# Patient Record
Sex: Male | Born: 1960 | Race: White | Hispanic: No | Marital: Married | Smoking: Former smoker
Health system: Southern US, Community
[De-identification: ages and names within clinical notes are randomized; demographics above are authoritative.]

## PROBLEM LIST (undated history)

## (undated) ENCOUNTER — Encounter
Admission: RE | Disposition: A | Discharge status: Home / Self Care | Payer: Self-pay | Source: Ambulatory Visit | Attending: Surgery

## (undated) DIAGNOSIS — R7303 Prediabetes: Secondary | ICD-10-CM

## (undated) DIAGNOSIS — R0600 Dyspnea, unspecified: Secondary | ICD-10-CM

## (undated) DIAGNOSIS — R011 Cardiac murmur, unspecified: Secondary | ICD-10-CM

## (undated) DIAGNOSIS — Z7982 Long term (current) use of aspirin: Secondary | ICD-10-CM

## (undated) DIAGNOSIS — M51369 Other intervertebral disc degeneration, lumbar region without mention of lumbar back pain or lower extremity pain: Secondary | ICD-10-CM

## (undated) DIAGNOSIS — R0789 Other chest pain: Secondary | ICD-10-CM

## (undated) DIAGNOSIS — E785 Hyperlipidemia, unspecified: Secondary | ICD-10-CM

## (undated) DIAGNOSIS — R1031 Right lower quadrant pain: Secondary | ICD-10-CM

## (undated) DIAGNOSIS — G44099 Other trigeminal autonomic cephalgias (TAC), not intractable: Secondary | ICD-10-CM

## (undated) DIAGNOSIS — I10 Essential (primary) hypertension: Secondary | ICD-10-CM

## (undated) DIAGNOSIS — R42 Dizziness and giddiness: Secondary | ICD-10-CM

## (undated) DIAGNOSIS — T753XXA Motion sickness, initial encounter: Secondary | ICD-10-CM

## (undated) DIAGNOSIS — K219 Gastro-esophageal reflux disease without esophagitis: Secondary | ICD-10-CM

## (undated) DIAGNOSIS — R490 Dysphonia: Secondary | ICD-10-CM

## (undated) HISTORY — DX: Gastro-esophageal reflux disease without esophagitis: K21.9

## (undated) HISTORY — DX: Essential (primary) hypertension: I10

## (undated) HISTORY — DX: Hyperlipidemia, unspecified: E78.5

## (undated) HISTORY — PX: APPENDECTOMY: SHX54

## (undated) SURGERY — EXAM UNDER ANESTHESIA
Anesthesia: IV Sedation (MBSC Only)

---

## 1997-11-09 HISTORY — PX: OTHER SURGICAL HISTORY: SHX169

## 2006-11-09 HISTORY — PX: CERVICAL DISCECTOMY: SHX98

## 2007-04-08 ENCOUNTER — Ambulatory Visit (HOSPITAL_COMMUNITY): Admission: RE | Admit: 2007-04-08 | Discharge: 2007-04-08 | Payer: Self-pay | Admitting: Neurological Surgery

## 2007-05-16 ENCOUNTER — Encounter: Admission: RE | Admit: 2007-05-16 | Discharge: 2007-05-16 | Payer: Self-pay | Admitting: Neurological Surgery

## 2011-03-24 NOTE — Op Note (Signed)
NAMEARDEN, TINOCO                 ACCOUNT NO.:  [id number]   MEDICAL RECORD NO.:  [id number]          PATIENT TYPE:  AMB   LOCATION:  SDS                          FACILITY:  [facility]   PHYSICIAN:  Tia Alert, MD     DATE OF BIRTH:  [birth year]   DATE OF PROCEDURE:  04/08/2007  DATE OF DISCHARGE:                               OPERATIVE REPORT   PREOPERATIVE DIAGNOSIS:  Cervical disk herniation, C6-7, with a right C7  radiculopathy.   POSTOPERATIVE DIAGNOSIS:  Cervical disk herniation, C6-7, with a right  C7 radiculopathy.   PROCEDURES PERFORMED:  1. Decompressive anterior cervical diskectomy, C6-7.  2. Anterior cervical arthrodesis, C6-7, utilizing a 7-mm      corticocancellous allograft.  3. Anterior cervical plating, C6-7, utilizing a reflex hybrid plate.   SURGEON:  Tia Alert, MD   ASSISTANT SURGEON:  Donalee Citrin, M.D.   ANESTHESIA:  Endotracheal.   COMPLICATIONS:  None apparent.   INDICATIONS:  The patient is a 50 year old gentleman who is referred  with neck and right arm pain that follows the C7 distribution.  He had  tried medical management for quite some time without significant relief.  His pain was quite severe.  He had an MRI which showed a large herniated  disk at C6-7 to the right with severe compression of the right C7 nerve  root.  I recommended a decompressive anterior cervical diskectomy with  cervical plating at C6-7.  He understood the risks, benefits, expected  outcome and wished to proceed.   DESCRIPTION OF PROCEDURE:  The patient was taken to the operating room.  After the induction of adequate general endotracheal anesthesia, he was  placed in the supine position on the operating room table.  His right  anterior cervical region was prepped with DuraPrep and then draped in  the usual sterile fashion.  Then, 5 mL of local anesthesia was injected.  A transverse incision was made to the right of midline and carried down  to the platysma which was  elevated, opened and undermined with the  Metzenbaum scissors, and then dissected in a plane medial to the  sternocleidomastoid muscle, and then from the carotid artery and lateral  to the trachea and esophagus to expose C6-7 along its column.  The  longus colli muscles were taken-down and a shadow- line retractor was  placed under this.  Once fluoroscopy confirmed the level at C6-7, the  annulus was incised at C6-7.  The initial discectomy was done with  pituitary rongeurs and curved curettes.  I then drilled the endplates to  prepare for arthrodesis and drilled to a height of 7-mm, drilling down  to the level of the posterior longitudinal ligament.  The operating  microscope was brought into the field.  The posterior longitudinal  ligament was opened with a blunt nerve hook and then removed, while  undercutting the bodies of C6 and C7.  Generous foraminotomies were  performed, but especially on the right side.  We identified the right C7  nerve root and followed it out past the pedicle level.  The nerve  root  had a high takeoff.  We pulled several large, free fragments from the  interspace, both from the axilla of the nerve root and from the shoulder  of the nerve root and lateral to the nerve root until the nerve root was  free and pulsatile.  We could see the spinal cord pulsatile through the  dura.  We then used the nerve hook to palpate into the foramina and into  the midline to assure adequate decompression.  The dura was capacious  and full all away across.  I then irrigated it with saline solution  containing bacitracin.  I measured the interspace to be 7-mm.  I used a  7-mm corticocancellous Allograft and tapped this into position at C6-7.  I then used a reflex hyperplate and placed two, 14-mm variable angle  screws into the bodies of C6 and C7, and these locked into the plate by  the locking mechanism within the plate.  I then irrigated with saline  solution containing  bacitracin to all bleeding points.  Once meticulous  hemostasis was achieved, I closed the platysma with 3-0 Vicryl.  I  closed the subcuticular tissue with 3-0 Vicryl and closed the skin with  benzoin and Steri-Strips.  The drapes were removed and a sterile  dressing was applied.   The patient was awakened from general anesthesia and transported to the  recovery room in stable condition.   At the end of the procedure, all sponge, needle and instrument counts  were correct.      Tia Alert, MD  Electronically Signed     DSJ/MEDQ  D:  04/08/2007  T:  04/08/2007  Job:  [id number]

## 2011-10-26 ENCOUNTER — Ambulatory Visit: Payer: Self-pay | Admitting: Urology

## 2012-07-04 ENCOUNTER — Ambulatory Visit: Payer: Self-pay | Admitting: Neurology

## 2012-07-14 ENCOUNTER — Ambulatory Visit: Payer: Self-pay | Admitting: Neurology

## 2012-11-09 HISTORY — PX: COLONOSCOPY: SHX174

## 2013-10-20 ENCOUNTER — Ambulatory Visit: Payer: Self-pay | Admitting: Gastroenterology

## 2014-08-31 ENCOUNTER — Encounter: Payer: Self-pay | Admitting: Family Medicine

## 2014-08-31 LAB — LIPID PANEL
Cholesterol, Total: 270 — AB (ref 100–199)
HDL: 30 — AB (ref 39–?)
LDL Cholesterol (Calc): 162 — AB (ref 0–99)
Triglycerides: 391 — AB (ref 0–149)
VLDL: 78 mg/dL

## 2014-08-31 LAB — TSH: TSH: 0.612 (ref 0.45–4.5)

## 2015-01-30 ENCOUNTER — Ambulatory Visit: Payer: Self-pay

## 2015-03-23 DIAGNOSIS — B019 Varicella without complication: Secondary | ICD-10-CM | POA: Insufficient documentation

## 2015-03-23 DIAGNOSIS — I10 Essential (primary) hypertension: Secondary | ICD-10-CM | POA: Insufficient documentation

## 2015-03-23 DIAGNOSIS — R519 Headache, unspecified: Secondary | ICD-10-CM | POA: Insufficient documentation

## 2015-03-23 DIAGNOSIS — K219 Gastro-esophageal reflux disease without esophagitis: Secondary | ICD-10-CM | POA: Insufficient documentation

## 2015-03-23 DIAGNOSIS — R51 Headache: Secondary | ICD-10-CM

## 2015-03-23 DIAGNOSIS — R1013 Epigastric pain: Secondary | ICD-10-CM | POA: Insufficient documentation

## 2015-03-23 DIAGNOSIS — B059 Measles without complication: Secondary | ICD-10-CM | POA: Insufficient documentation

## 2015-03-23 DIAGNOSIS — E785 Hyperlipidemia, unspecified: Secondary | ICD-10-CM | POA: Insufficient documentation

## 2015-03-23 DIAGNOSIS — R5383 Other fatigue: Secondary | ICD-10-CM | POA: Insufficient documentation

## 2015-03-23 DIAGNOSIS — K922 Gastrointestinal hemorrhage, unspecified: Secondary | ICD-10-CM | POA: Insufficient documentation

## 2015-03-23 DIAGNOSIS — K449 Diaphragmatic hernia without obstruction or gangrene: Secondary | ICD-10-CM

## 2015-03-25 ENCOUNTER — Ambulatory Visit
Admission: RE | Admit: 2015-03-25 | Discharge: 2015-03-25 | Disposition: A | Discharge status: Home / Self Care | Payer: BLUE CROSS/BLUE SHIELD | Source: Ambulatory Visit | Attending: Family Medicine | Admitting: Family Medicine

## 2015-03-25 ENCOUNTER — Other Ambulatory Visit: Payer: Self-pay | Admitting: Family Medicine

## 2015-03-25 ENCOUNTER — Encounter: Payer: Self-pay | Admitting: Family Medicine

## 2015-03-25 DIAGNOSIS — R1032 Left lower quadrant pain: Secondary | ICD-10-CM | POA: Insufficient documentation

## 2015-03-25 DIAGNOSIS — R0781 Pleurodynia: Secondary | ICD-10-CM | POA: Diagnosis present

## 2015-03-25 LAB — COMPREHENSIVE METABOLIC PANEL
ALT: 50 — AB (ref 0–44)
AST: 24 U/L (ref 0–40)
Albumin/Globulin Ratio: 1.8
Albumin: 4.3
Alkaline Phosphatase: 82 U/L (ref 39–117)
BILIRUBIN TOTAL: 0.6 mg/dL
BUN / CREAT RATIO: 16
BUN: 13
CO2: 24
CREATININE: 0.83
Calcium: 9.1 mg/dL
Chloride: 104 mmol/L
GFR CALC NON AF AMER: 101
GLOBULIN, TOTAL: 2.4
Glucose: 96
PROTEIN: 6.7
Potassium: 4.1 mmol/L
Sodium: 144

## 2015-04-16 ENCOUNTER — Other Ambulatory Visit: Payer: Self-pay

## 2015-04-16 DIAGNOSIS — R310 Gross hematuria: Secondary | ICD-10-CM

## 2015-04-24 ENCOUNTER — Other Ambulatory Visit: Payer: Self-pay | Admitting: Family Medicine

## 2015-04-24 MED ORDER — SIMVASTATIN 20 MG PO TABS: 20.0000 mg | ORAL_TABLET | Freq: Every day | ORAL | Status: DC

## 2015-04-30 ENCOUNTER — Ambulatory Visit
Admission: RE | Admit: 2015-04-30 | Discharge: 2015-04-30 | Disposition: A | Discharge status: Home / Self Care | Payer: BLUE CROSS/BLUE SHIELD | Source: Ambulatory Visit | Attending: Urology | Admitting: Urology

## 2015-04-30 DIAGNOSIS — M549 Dorsalgia, unspecified: Secondary | ICD-10-CM | POA: Insufficient documentation

## 2015-04-30 DIAGNOSIS — R1032 Left lower quadrant pain: Secondary | ICD-10-CM | POA: Insufficient documentation

## 2015-04-30 DIAGNOSIS — R310 Gross hematuria: Secondary | ICD-10-CM

## 2015-04-30 DIAGNOSIS — G8929 Other chronic pain: Secondary | ICD-10-CM | POA: Diagnosis not present

## 2015-04-30 MED ORDER — IOHEXOL 300 MG/ML  SOLN
125.0000 mL | Freq: Once | INTRAMUSCULAR | Status: AC | PRN
  Administered 2015-04-30: 125 mL via INTRAVENOUS

## 2015-05-01 ENCOUNTER — Telehealth: Payer: Self-pay | Admitting: Family Medicine

## 2015-05-01 NOTE — Telephone Encounter (Signed)
Pt needs CT test result. It's on your desk.

## 2015-05-01 NOTE — Telephone Encounter (Signed)
I would be glad to refer him to Dr. Evette Cristal or Rosezetta Schlatter if he doesn't want to go to [facility] surgical, or to [facility] general surgery.  It is not that I want him to be in pain, I just don't know what is causing the pain.

## 2015-05-02 NOTE — Telephone Encounter (Signed)
Pt advised. He will call if he decide to see any general surgeon and will be happy to make that referral.

## 2015-05-06 ENCOUNTER — Telehealth: Payer: Self-pay | Admitting: Family Medicine

## 2015-05-06 NOTE — Telephone Encounter (Signed)
Pt states he received his test results last week but still had a few questions.

## 2015-05-06 NOTE — Telephone Encounter (Signed)
Called pt and left message to call us back

## 2015-05-08 NOTE — Telephone Encounter (Signed)
No answer twice

## 2015-05-09 NOTE — Telephone Encounter (Signed)
Called and someone sat phone down then it hung up a few minutes later. Sounded like they were in a car. I have attempted to reach this person several times. I am glad to answer his questions but will wait for him to contact us again. I am closing this note. [facility]

## 2015-05-10 ENCOUNTER — Telehealth: Payer: Self-pay | Admitting: Family Medicine

## 2015-05-10 NOTE — Telephone Encounter (Signed)
Pt. Return your call  [phone]. Thanks

## 2015-05-10 NOTE — Telephone Encounter (Signed)
Spoke to pt wants to get reason for groin pain and as per Amy xray shows constipation and dulcolax can help pt is already doing that and next we can refer him to Dr. Evette CristalSankar but pt will call us if he decided to schedule. Nisha

## 2015-05-10 NOTE — Telephone Encounter (Signed)
Ok-jh 

## 2015-05-15 ENCOUNTER — Ambulatory Visit: Payer: Self-pay | Admitting: Urology

## 2015-05-31 ENCOUNTER — Ambulatory Visit: Payer: Self-pay | Admitting: Family Medicine

## 2015-06-21 ENCOUNTER — Telehealth: Payer: Self-pay | Admitting: Family Medicine

## 2015-06-21 ENCOUNTER — Other Ambulatory Visit: Payer: Self-pay | Admitting: Family Medicine

## 2015-06-21 MED ORDER — SIMVASTATIN 20 MG PO TABS: 20.0000 mg | ORAL_TABLET | Freq: Every day | ORAL | Status: DC

## 2015-06-21 NOTE — Telephone Encounter (Signed)
Pt tried to refill his simvastatin but the pharmacy said there wasn't any refills left.  He is leaving to go out of the country Monday morning and will be out.  He will return on Sept 20th.  Please call [phone]

## 2015-06-21 NOTE — Telephone Encounter (Signed)
Please review

## 2015-06-24 ENCOUNTER — Other Ambulatory Visit: Payer: Self-pay | Admitting: Family Medicine

## 2015-06-24 MED ORDER — SIMVASTATIN 20 MG PO TABS: 20.0000 mg | ORAL_TABLET | Freq: Every day | ORAL | Status: DC

## 2015-12-30 DIAGNOSIS — M7040 Prepatellar bursitis, unspecified knee: Secondary | ICD-10-CM | POA: Insufficient documentation

## 2016-02-21 ENCOUNTER — Other Ambulatory Visit: Payer: Self-pay | Admitting: Family Medicine

## 2016-03-13 ENCOUNTER — Encounter: Payer: Self-pay | Admitting: Family Medicine

## 2016-03-13 ENCOUNTER — Ambulatory Visit (INDEPENDENT_AMBULATORY_CARE_PROVIDER_SITE_OTHER): Payer: Self-pay | Admitting: Family Medicine

## 2016-03-13 VITALS — BP 130/86 | HR 66 | Temp 98.4°F | Resp 16 | Ht 70.0 in | Wt 193.0 lb

## 2016-03-13 DIAGNOSIS — E785 Hyperlipidemia, unspecified: Secondary | ICD-10-CM

## 2016-03-13 DIAGNOSIS — R0789 Other chest pain: Secondary | ICD-10-CM

## 2016-03-13 DIAGNOSIS — M7522 Bicipital tendinitis, left shoulder: Secondary | ICD-10-CM

## 2016-03-13 MED ORDER — NAPROXEN 500 MG PO TABS: 500.0000 mg | ORAL_TABLET | Freq: Two times a day (BID) | ORAL | Status: DC

## 2016-03-13 NOTE — Assessment & Plan Note (Signed)
Check lipids to determine if at goal. Consider change to atorvastatin or crestor.

## 2016-03-13 NOTE — Progress Notes (Signed)
Subjective:    Patient ID: Charles Shepard, male    DOB: 06/29/1961, 55 y.o.   MRN: [id number]  HPI: Charles Shepard is a 55 y.o. male presenting on 03/13/2016 for Arm Pain   HPI  Presents for L arm pain x a few month. Pain is located in the elbow.  When patient sleeps arm goes numb. Happens every night when he sleeps. Pain occurs during day time. Sometimes worse than others. Hurts and AC fossa. Noticed the L arm is weaker. Job requires lots of lifting. No numbness during the day. Home treatment: heat.  Pt did endorse to nurse some chest pains. Noticed squeezing in the chest a few times in the past month. Sensation lasted about 30 seconds and chest felt heavy for a few minutes. No SOB. No diaphoresis. No radiation to arm and jaw.  Occurred a night in bed. Once in the AM- getting ready for work. Travels a lot for work. Brother had heart attack age 54. Non-smoker.  Smokes pot.   Past Medical History  Diagnosis Date  . GERD (gastroesophageal reflux disease)   . Dyslipidemia     Current Outpatient Prescriptions on File Prior to Visit  Medication Sig  . DEXILANT 60 MG capsule TAKE ONE CAPSULE BY MOUTH DAILY  . ranitidine (ZANTAC) 150 MG tablet Take by mouth.  . simvastatin (ZOCOR) 20 MG tablet Take 1 tablet (20 mg total) by mouth at bedtime.   No current facility-administered medications on file prior to visit.    Review of Systems  Constitutional: Negative for fever and chills.  HENT: Negative.   Respiratory: Negative for chest tightness, shortness of breath and wheezing.   Cardiovascular: Positive for chest pain. Negative for palpitations and leg swelling.  Gastrointestinal: Negative for nausea, vomiting and abdominal pain.  Endocrine: Negative.   Genitourinary: Negative for dysuria, urgency, discharge, penile pain and testicular pain.  Musculoskeletal: Positive for myalgias and arthralgias. Negative for back pain and joint swelling.  Skin: Negative.   Neurological: Positive for  weakness (Feels L arm is weak. ). Negative for dizziness, syncope, numbness and headaches.  Psychiatric/Behavioral: Negative for sleep disturbance and dysphoric mood.   Per HPI unless specifically indicated above     Objective:    BP 130/86 mmHg  Pulse 66  Temp(Src) 98.4 F (36.9 C) (Oral)  Resp 16  Ht 5\' 10"  (1.778 m)  Wt 193 lb (87.544 kg)  BMI 27.69 kg/m2  Wt Readings from Last 3 Encounters:  03/13/16 193 lb (87.544 kg)  03/23/15 191 lb (86.637 kg)    Physical Exam  Constitutional: He is oriented to person, place, and time. He appears well-developed and well-nourished. No distress.  HENT:  Head: Normocephalic and atraumatic.  Neck: Neck supple. No thyromegaly present.  Cardiovascular: Normal rate, regular rhythm, normal heart sounds and normal pulses.  Exam reveals no gallop and no friction rub.   No murmur heard. Pulmonary/Chest: Effort normal and breath sounds normal. He has no wheezes. He has no rhonchi. He has no rales.  Abdominal: Soft. Bowel sounds are normal. He exhibits no distension. There is no tenderness. There is no rebound.  Musculoskeletal: Normal range of motion. He exhibits no edema.       Left shoulder: Normal.       Right elbow: Normal.      Left elbow: He exhibits normal range of motion and no swelling. Tenderness (at the biceps tendon in the [facility]a) found.       Right upper arm:  He exhibits tenderness (along biceps tendon).  Neurological: He is alert and oriented to person, place, and time. He has normal reflexes.  Skin: Skin is warm and dry. No rash noted. No erythema.  Psychiatric: He has a normal mood and affect. His behavior is normal. Thought content normal.   No results found for this or any previous visit.    Assessment & Plan:   Problem List Items Addressed This Visit      Other   Dyslipidemia    Check lipids to determine if at goal. Consider change to atorvastatin or crestor.        Other Visit Diagnoses    Chest pressure    -   Primary    ECG WNL. However given risk factors and family history- pt will benefit from cardiology consult. Pt to ER for chest pain occurs again. Check CMET and lipids.    Relevant Orders    EKG 12-Lead    Comprehensive metabolic panel    Lipid Profile    Ambulatory referral to Cardiology    Biceps tendonitis on left        BID naproxen for pain and swelling. Heat or ice for comfort. No heavy lifting. Recheck 2-4 weeks if needed.     Relevant Medications    naproxen (NAPROSYN) 500 MG tablet       Meds ordered this encounter  Medications  . naproxen (NAPROSYN) 500 MG tablet    Sig: Take 1 tablet (500 mg total) by mouth 2 (two) times daily with a meal.    Dispense:  30 tablet    Refill:  1    Order Specific Question:  Supervising Provider    Answer:  Janeann Forehand [[id number]]      Follow up plan: Return in about 4 weeks (around 04/10/2016) for elbow pain.

## 2016-03-13 NOTE — Patient Instructions (Signed)
Tendonitis elbow: Take naproxen 500mg  twice daily to 1-2 weeks. No heavy lifting. Rest arm as much as you can. Use heat and ice as needed for pain.   Chest pains: Go to the ER if they occur again. I think it would be a good idea to see cardiology given your brother's history.  Please seek immediate medical attention at ER or Urgent Care if you develop: Chest pain, pressure or tightness. Shortness of breath accompanied by nausea or diaphoresis Visual changes Numbness or tingling on one side of the body Facial droop Altered mental status Or any concerning symptoms.

## 2016-03-24 ENCOUNTER — Encounter: Payer: Self-pay | Admitting: *Deleted

## 2016-03-24 ENCOUNTER — Ambulatory Visit: Payer: BLUE CROSS/BLUE SHIELD | Admitting: Cardiovascular Disease

## 2016-04-02 NOTE — Telephone Encounter (Signed)
This encounter was created in error - please disregard.

## 2016-04-03 ENCOUNTER — Emergency Department
Admission: EM | Admit: 2016-04-03 | Discharge: 2016-04-04 | Disposition: A | Discharge status: Home / Self Care | Payer: No Typology Code available for payment source | Attending: Emergency Medicine | Admitting: Emergency Medicine

## 2016-04-03 ENCOUNTER — Emergency Department: Payer: No Typology Code available for payment source

## 2016-04-03 DIAGNOSIS — Z79899 Other long term (current) drug therapy: Secondary | ICD-10-CM | POA: Diagnosis not present

## 2016-04-03 DIAGNOSIS — Z87891 Personal history of nicotine dependence: Secondary | ICD-10-CM | POA: Diagnosis not present

## 2016-04-03 DIAGNOSIS — Y92410 Unspecified street and highway as the place of occurrence of the external cause: Secondary | ICD-10-CM | POA: Insufficient documentation

## 2016-04-03 DIAGNOSIS — Y999 Unspecified external cause status: Secondary | ICD-10-CM | POA: Insufficient documentation

## 2016-04-03 DIAGNOSIS — S92351A Displaced fracture of fifth metatarsal bone, right foot, initial encounter for closed fracture: Secondary | ICD-10-CM | POA: Insufficient documentation

## 2016-04-03 DIAGNOSIS — S99921A Unspecified injury of right foot, initial encounter: Secondary | ICD-10-CM | POA: Diagnosis present

## 2016-04-03 DIAGNOSIS — Y9389 Activity, other specified: Secondary | ICD-10-CM | POA: Insufficient documentation

## 2016-04-03 DIAGNOSIS — S92301A Fracture of unspecified metatarsal bone(s), right foot, initial encounter for closed fracture: Secondary | ICD-10-CM

## 2016-04-03 MED ORDER — FENTANYL CITRATE (PF) 100 MCG/2ML IJ SOLN
50.0000 ug | Freq: Once | INTRAMUSCULAR | Status: AC
  Administered 2016-04-03: 50 ug via INTRAVENOUS
  Filled 2016-04-03: qty 2

## 2016-04-03 MED ORDER — ONDANSETRON HCL 4 MG/2ML IJ SOLN
4.0000 mg | Freq: Once | INTRAMUSCULAR | Status: AC
  Administered 2016-04-03: 4 mg via INTRAVENOUS
  Filled 2016-04-03: qty 2

## 2016-04-03 NOTE — ED Notes (Signed)
MD at bedside. 
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
## 2016-04-04 ENCOUNTER — Emergency Department: Payer: No Typology Code available for payment source

## 2016-04-04 LAB — BASIC METABOLIC PANEL
ANION GAP: 6 (ref 5–15)
BUN: 17 mg/dL (ref 6–20)
CHLORIDE: 109 mmol/L (ref 101–111)
CO2: 25 mmol/L (ref 22–32)
Calcium: 9 mg/dL (ref 8.9–10.3)
Creatinine, Ser: 1.17 mg/dL (ref 0.61–1.24)
GFR calc Af Amer: >60 mL/min (ref >60)
Glucose, Bld: 92 mg/dL (ref 65–99)
POTASSIUM: 3.9 mmol/L (ref 3.5–5.1)
SODIUM: 140 mmol/L (ref 135–145)

## 2016-04-04 LAB — CBC WITH DIFFERENTIAL/PLATELET
Basophils Absolute: 0 10*3/uL (ref 0–0.1)
Basophils Relative: 1 %
EOS ABS: 0.2 10*3/uL (ref 0–0.7)
EOS PCT: 3 %
HCT: 45.3 % (ref 40.0–52.0)
Hemoglobin: 15.6 g/dL (ref 13.0–18.0)
LYMPHS ABS: 2.1 10*3/uL (ref 1.0–3.6)
LYMPHS PCT: 32 %
MCH: 32.4 pg (ref 26.0–34.0)
MCHC: 34.5 g/dL (ref 32.0–36.0)
MCV: 93.9 fL (ref 80.0–100.0)
MONO ABS: 0.7 10*3/uL (ref 0.2–1.0)
Monocytes Relative: 11 %
Neutro Abs: 3.5 10*3/uL (ref 1.4–6.5)
Neutrophils Relative %: 53 %
PLATELETS: 217 10*3/uL (ref 150–440)
RBC: 4.82 MIL/uL (ref 4.40–5.90)
RDW: 13.6 % (ref 11.5–14.5)
WBC: 6.5 10*3/uL (ref 3.8–10.6)

## 2016-04-04 MED ORDER — OXYCODONE-ACETAMINOPHEN 5-325 MG PO TABS
ORAL_TABLET | ORAL | Status: AC
  Filled 2016-04-04: qty 1

## 2016-04-04 MED ORDER — IOPAMIDOL (ISOVUE-300) INJECTION 61%
100.0000 mL | Freq: Once | INTRAVENOUS | Status: AC | PRN
Start: 2016-04-04 — End: 2016-04-04
  Administered 2016-04-04: 100 mL via INTRAVENOUS

## 2016-04-04 MED ORDER — OXYCODONE-ACETAMINOPHEN 5-325 MG PO TABS
2.0000 | ORAL_TABLET | Freq: Once | ORAL | Status: AC
  Administered 2016-04-04: 2 via ORAL

## 2016-04-04 MED ORDER — OXYCODONE-ACETAMINOPHEN 5-325 MG PO TABS: 1.0000 | ORAL_TABLET | Freq: Four times a day (QID) | ORAL | Status: DC | PRN

## 2016-04-04 MED ORDER — OXYCODONE-ACETAMINOPHEN 5-325 MG PO TABS
ORAL_TABLET | ORAL | Status: DC
Start: 2016-04-04 — End: 2016-04-04
  Filled 2016-04-04: qty 2

## 2016-04-04 NOTE — ED Notes (Signed)
Reviewed d/c instructions, follow-up care, prescriptions, use of ice/elevation with pt. Pt verbalized understanding 
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
## 2016-04-04 NOTE — ED Notes (Signed)
Paula from lab called back. Reported she found blood tubes, and would begin processing blood immediately

## 2016-04-04 NOTE — ED Notes (Signed)
Called lab to check status of pt's bloodwork

## 2016-04-23 ENCOUNTER — Other Ambulatory Visit: Payer: Self-pay | Admitting: Family Medicine

## 2016-05-11 ENCOUNTER — Ambulatory Visit
Admission: RE | Admit: 2016-05-11 | Discharge: 2016-05-11 | Disposition: A | Discharge status: Home / Self Care | Payer: 59 | Source: Ambulatory Visit | Attending: Family Medicine | Admitting: Family Medicine

## 2016-05-11 ENCOUNTER — Ambulatory Visit (INDEPENDENT_AMBULATORY_CARE_PROVIDER_SITE_OTHER): Payer: 59 | Admitting: Family Medicine

## 2016-05-11 ENCOUNTER — Encounter: Payer: Self-pay | Admitting: Family Medicine

## 2016-05-11 VITALS — BP 125/82 | HR 69 | Temp 97.7°F | Resp 16 | Ht 70.0 in | Wt 195.0 lb

## 2016-05-11 DIAGNOSIS — M7522 Bicipital tendinitis, left shoulder: Secondary | ICD-10-CM | POA: Diagnosis not present

## 2016-05-11 DIAGNOSIS — M25552 Pain in left hip: Secondary | ICD-10-CM | POA: Diagnosis not present

## 2016-05-11 MED ORDER — NAPROXEN 500 MG PO TABS: 500.0000 mg | ORAL_TABLET | Freq: Two times a day (BID) | ORAL | Status: DC
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
## 2016-05-15 ENCOUNTER — Ambulatory Visit: Payer: Self-pay | Admitting: Family Medicine

## 2016-06-17 ENCOUNTER — Other Ambulatory Visit: Payer: Self-pay | Admitting: Family Medicine

## 2016-09-25 ENCOUNTER — Ambulatory Visit (INDEPENDENT_AMBULATORY_CARE_PROVIDER_SITE_OTHER): Payer: 59 | Admitting: Family Medicine

## 2016-09-25 ENCOUNTER — Encounter: Payer: Self-pay | Admitting: Family Medicine

## 2016-09-25 VITALS — BP 127/84 | HR 70 | Temp 98.2°F | Resp 16 | Ht 70.0 in | Wt 195.0 lb

## 2016-09-25 DIAGNOSIS — K601 Chronic anal fissure: Secondary | ICD-10-CM | POA: Diagnosis not present

## 2016-09-25 DIAGNOSIS — L290 Pruritus ani: Secondary | ICD-10-CM | POA: Diagnosis not present

## 2016-09-25 MED ORDER — TRIAMCINOLONE ACETONIDE 0.025 % EX CREA: 1.0000 "application " | TOPICAL_CREAM | Freq: Two times a day (BID) | CUTANEOUS | 1 refills | Status: DC

## 2016-09-25 NOTE — Patient Instructions (Signed)
Thank you for coming in to clinic today.  1. For your rectal itching and bleeding. I do see evidence of irritated inflamed skin, which we can see in a variety of skin conditions, sometimes even psoriasis can only affect this region to cause this problem. It makes the skin thin and easy to have fissures or tears that cause bleeding and worsening itching.  Try topical steroid cream small amount around anus externally only, twice a day for 2 weeks. Try to keep this area dry and clean as you are. - Stop medicine after 2 weeks - If itching is worse you can dry benadryl at night  In future if the fissure is not healing well, we can try other topical rx to help it heal  We may need to get you established with Dermatology if not making progress.  Please request your Colonoscopy and Endoscopy report from Dr Annabell SabalWohl's office.  [facility] [place] Location [address] [place], [place] [address] Phone: [phone]  [facility] [address] [place], [place] [address] Phone: [phone]    Please schedule a follow-up appointment with Dr. Althea CharonKaramalegos in 2 to 4 weeks to follow-up Rectal Bleeding / Itching  If you have any other questions or concerns, please feel free to call the clinic or send a message through MyChart. You may also schedule an earlier appointment if necessary.  Charles PilarAlexander Kino Dunsworth, DO [facility]

## 2016-09-25 NOTE — Progress Notes (Signed)
Subjective:    Patient ID: Charles Shepard, male    DOB: 01/28/1961, 55 y.o.   MRN: [id number]  Charles GladdenDarryl L Shepard is a 55 y.o. male presenting on 09/25/2016 for Rectal Bleeding (tender and bleed a little onset several month)   HPI   RECTAL PAIN / BLEEDING: - Reports he has had rectal itching over past several months, also associated with tenderness. He does admit to seeing some bright red blood small streaks on toilet paper only occasionally about 1-2x a week. Worse itching at night, tender to touch but not at rest or sitting. Also with occasional burning sensation with bowel movements. - Regular bowel movements 3-4 times daily, soft, not dry or hard, no straining, without pain or cramping - No prior diagnosis of hemorrhoids - Previously had colonoscopy 10/2013 and upper endoscopy per GI Dr Charles Shepard, he does not recall the results. Also h/o GERD. Unsure if he has ever had PUD. Unsure if prior hemorrhoids. GERD is controlled mostly on Dexilant 60mg , it does get worse with eating food later in evening but denies constant abdominal pain related to eating. - Takes ibuprofen infrequently maybe 1x monthly now, no regular NSAID use - Denies any recent illness, fevers/chills, dark stools, blood mixed with stool, abdominal distention or pain, constipation, rash  Social History  Substance Use Topics  . Smoking status: Former Games developermoker  . Smokeless tobacco: Not on file  . Alcohol use 3.6 oz/week    6 Cans of beer per week    Review of Systems Per HPI unless specifically indicated above     Objective:    BP 127/84   Pulse 70   Temp 98.2 F (36.8 C) (Oral)   Resp 16   Ht 5\' 10"  (1.778 m)   Wt 195 lb (88.5 kg)   BMI 27.98 kg/m   Wt Readings from Last 3 Encounters:  09/25/16 195 lb (88.5 kg)  05/11/16 195 lb (88.5 kg)  04/03/16 190 lb (86.2 kg)    Physical Exam  Constitutional: He appears well-developed and well-nourished. No distress.  Well-appearing, comfortable, cooperative  Cardiovascular:  Normal rate.   Pulmonary/Chest: Effort normal.  Abdominal: Soft.  Genitourinary:  Genitourinary Comments: Rectal/DRE: External anus with diffuse erythematous appearance, see skin findings. Posteriorly very thin skin with some linear semi healed anal fissures x 2, no active bleeding but potentially could easily bleed. Anteriorly mild flat soft external hemorrhoidal tissue that is not inflamed.  DRE without appreciation of internal hemorrhoids or rectal bleeding.  Neurological: He is alert.  Skin: Skin is warm and dry. Rash (Peri-anal) noted. He is not diaphoretic. There is erythema (Peri-anal skin diffusely erythematous appearing without rash or scaling.).  Nursing note and vitals reviewed.   Results for orders placed or performed during the hospital encounter of 04/03/16  CBC with Differential  Result Value Ref Range   WBC 6.5 3.8 - 10.6 K/uL   RBC 4.82 4.40 - 5.90 MIL/uL   Hemoglobin 15.6 13.0 - 18.0 g/dL   HCT 40.945.3 81.140.0 - 91.452.0 %   MCV 93.9 80.0 - 100.0 fL   MCH 32.4 26.0 - 34.0 pg   MCHC 34.5 32.0 - 36.0 g/dL   RDW 78.213.6 95.611.5 - 21.314.5 %   Platelets 217 150 - 440 K/uL   Neutrophils Relative % 53 %   Neutro Abs 3.5 1.4 - 6.5 K/uL   Lymphocytes Relative 32 %   Lymphs Abs 2.1 1.0 - 3.6 K/uL   Monocytes Relative 11 %   Monocytes Absolute  0.7 0.2 - 1.0 K/uL   Eosinophils Relative 3 %   Eosinophils Absolute 0.2 0 - 0.7 K/uL   Basophils Relative 1 %   Basophils Absolute 0.0 0 - 0.1 K/uL  Basic metabolic panel  Result Value Ref Range   Sodium 140 135 - 145 mmol/L   Potassium 3.9 3.5 - 5.1 mmol/L   Chloride 109 101 - 111 mmol/L   CO2 25 22 - 32 mmol/L   Glucose, Bld 92 65 - 99 mg/dL   BUN 17 6 - 20 mg/dL   Creatinine, Ser 1.611.17 0.61 - 1.24 mg/dL   Calcium 9.0 8.9 - 09.610.3 mg/dL   GFR calc non Af Amer >60 >60 mL/min   GFR calc Af Amer >60 >60 mL/min   Anion gap 6 5 - 15      Assessment & Plan:   Problem List Items Addressed This Visit    Pruritus ani - Primary    History and exam  most consistent with a pruritic erythematous rash localized to anus, consider derm etiology such as inverse psoriasis (especially with itching worse at night, but no h/o psoriasis or other skin involvement), also with chronic anal fissure posteriorly with evidence of prior healed fissure, not active bleeding today, also contributing to itching most likely. Additionally considered hemorrhoidal tissue, not significant on exam today - Awaiting records request of colonoscopy / endoscopy 2014 Dr Charles Shepard  Plan: 1. Start with topical corticosteroid Triamcinolone 0.025% cream per recommendations for this area of skin. BID x 2 weeks, can repeat in 2 weeks if needed 2. Can try OTC benadryl PRN itching at night 3. Avoid scratching 4. Gentle routine hygiene to avoid irritation, limit moisture 5. Follow-up 4 weeks, sooner if needed, consider referral to Derm if not improving vs inc potency steroids      Relevant Medications   triamcinolone (KENALOG) 0.025 % cream   Chronic anal fissure    Exam with posterior x 2 anal fissures appear mostly healed at this time without active bleeding, but thin erythematous skin with potential to re-open, likely recurrent cause of his bleeding and burning. - Treat topical steroid for pruritus ani - Consider topical Diltiazem Gel 2% TID in near future if worsening fissure / bleeding - Follow-up         Meds ordered this encounter  Medications  .       .       .       . triamcinolone (KENALOG) 0.025 % cream    Sig: Apply 1 application topically 2 (two) times daily. To affected area for 2 weeks then stop.    Dispense:  30 g    Refill:  1      Follow up plan: Return in about 4 weeks (around 10/23/2016) for rectal bleeding and itching.  Charles PilarAlexander Omario Ander, DO [facility] 09/26/2016, 10:01 AM

## 2016-09-26 NOTE — Assessment & Plan Note (Addendum)
History and exam most consistent with a pruritic erythematous rash localized to anus, consider derm etiology such as inverse psoriasis (especially with itching worse at night, but no h/o psoriasis or other skin involvement), also with chronic anal fissure posteriorly with evidence of prior healed fissure, not active bleeding today, also contributing to itching most likely. Additionally considered hemorrhoidal tissue, not significant on exam today - Awaiting records request of colonoscopy / endoscopy 2014 Dr Servando SnareWohl  Plan: 1. Start with topical corticosteroid Triamcinolone 0.025% cream per recommendations for this area of skin. BID x 2 weeks, can repeat in 2 weeks if needed 2. Can try OTC benadryl PRN itching at night 3. Avoid scratching 4. Gentle routine hygiene to avoid irritation, limit moisture 5. Follow-up 4 weeks, sooner if needed, consider referral to Derm if not improving vs inc potency steroids

## 2016-09-26 NOTE — Assessment & Plan Note (Signed)
Exam with posterior x 2 anal fissures appear mostly healed at this time without active bleeding, but thin erythematous skin with potential to re-open, likely recurrent cause of his bleeding and burning. - Treat topical steroid for pruritus ani - Consider topical Diltiazem Gel 2% TID in near future if worsening fissure / bleeding - Follow-up

## 2016-11-30 DIAGNOSIS — M71572 Other bursitis, not elsewhere classified, left ankle and foot: Secondary | ICD-10-CM | POA: Diagnosis not present

## 2016-11-30 DIAGNOSIS — M722 Plantar fascial fibromatosis: Secondary | ICD-10-CM | POA: Diagnosis not present

## 2016-12-07 ENCOUNTER — Other Ambulatory Visit: Payer: Self-pay | Admitting: Family Medicine

## 2016-12-07 DIAGNOSIS — K449 Diaphragmatic hernia without obstruction or gangrene: Principal | ICD-10-CM

## 2016-12-07 DIAGNOSIS — K219 Gastro-esophageal reflux disease without esophagitis: Secondary | ICD-10-CM

## 2016-12-07 MED ORDER — DEXLANSOPRAZOLE 60 MG PO CPDR: 1.0000 | DELAYED_RELEASE_CAPSULE | Freq: Every day | ORAL | 5 refills | Status: DC

## 2016-12-09 ENCOUNTER — Other Ambulatory Visit: Payer: Self-pay | Admitting: Family Medicine

## 2016-12-09 DIAGNOSIS — K449 Diaphragmatic hernia without obstruction or gangrene: Principal | ICD-10-CM

## 2016-12-09 DIAGNOSIS — K219 Gastro-esophageal reflux disease without esophagitis: Secondary | ICD-10-CM

## 2016-12-09 NOTE — Telephone Encounter (Signed)
Patient requested refill on Dexilant it has been submitted electronically.

## 2017-02-05 ENCOUNTER — Encounter: Payer: Self-pay | Admitting: Family Medicine

## 2017-02-05 ENCOUNTER — Other Ambulatory Visit: Payer: Self-pay | Admitting: Family Medicine

## 2017-02-05 ENCOUNTER — Ambulatory Visit (INDEPENDENT_AMBULATORY_CARE_PROVIDER_SITE_OTHER): Payer: 59 | Admitting: Family Medicine

## 2017-02-05 VITALS — BP 113/84 | HR 62 | Temp 98.1°F | Resp 16 | Ht 70.0 in | Wt 189.0 lb

## 2017-02-05 DIAGNOSIS — I10 Essential (primary) hypertension: Secondary | ICD-10-CM | POA: Diagnosis not present

## 2017-02-05 DIAGNOSIS — L290 Pruritus ani: Secondary | ICD-10-CM

## 2017-02-05 DIAGNOSIS — R7309 Other abnormal glucose: Secondary | ICD-10-CM | POA: Diagnosis not present

## 2017-02-05 DIAGNOSIS — E785 Hyperlipidemia, unspecified: Secondary | ICD-10-CM | POA: Diagnosis not present

## 2017-02-05 DIAGNOSIS — I208 Other forms of angina pectoris: Secondary | ICD-10-CM | POA: Diagnosis not present

## 2017-02-05 DIAGNOSIS — R5382 Chronic fatigue, unspecified: Secondary | ICD-10-CM

## 2017-02-05 DIAGNOSIS — I2089 Other forms of angina pectoris: Secondary | ICD-10-CM | POA: Insufficient documentation

## 2017-02-05 NOTE — Patient Instructions (Signed)
Thank you for coming in to clinic today.  1.  Last seen 06/2016 for initial consult, contact them to request re-schedule of follow-up. If you need a new referral, call our office back and we can order this.  [facility] ([facility]) [address] [place], [place]  [address] Phone: [phone]  Dwayne D. Callwood, MD  ----------- If you have any significant chest pain that does not go away within 30 minutes, is accompanied by nausea, sweating, shortness of breath, or made worse by activity, this may be evidence of a heart attack, especially if symptoms worsening instead of improving, please call 911 or go directly to the emergency room immediately for evaluation. ------------------------------------  Keep using topical steroid cream as discussed, let me know if need refill In future if really not resolving or worsening, then next step would likely be Dermatology  ----------------------  You will be due for FASTING BLOOD WORK (no food or drink after midnight before, only water or coffee without cream/sugar on the morning of)  [facility]  Please schedule a follow-up appointment with Dr. Althea Charon in 6-9 months for Annual Physical  If you have any other questions or concerns, please feel free to call the clinic or send a message through MyChart. You may also schedule an earlier appointment if necessary.  Saralyn Pilar, DO [facility], [place]

## 2017-02-05 NOTE — Assessment & Plan Note (Signed)
Chronic worsening problem, with constellation of symptoms, concern for angina as previously evaluated by Cardiology in 2017, however patient was lost to follow-up did not get stress test ECHO etc. - suspect still may be multifactorial, however need to rule out cardiac etiology first - Check chemistry, TSH today - Patient to contact [facility]iology to re-schedule f/u and work-up, and if need new referral contact our office, advised when to go to ED if worsening or acute symptoms

## 2017-02-05 NOTE — Assessment & Plan Note (Signed)
Stable, controlled BP No known complications  Plan: 1. Continue current meds 2. On BB, follow-up with Cardiology regarding anginal symptoms for further work-up 3. Check chemistry today

## 2017-02-05 NOTE — Assessment & Plan Note (Signed)
Check fasting lipids today, [facility] Continue Simvastatin  daily Now concerns with possible anginal symptoms, previously lost to follow-up from cardiology, may need adjusted statin dose based on cholesterol results and cardiac findings Continue ASA 81

## 2017-02-05 NOTE — Assessment & Plan Note (Signed)
Chronic worsening problem, with constellation of symptoms, concern for angina as previously evaluated by Cardiology in 2017, however patient was lost to follow-up did not get stress test ECHO etc. - suspect still may be multifactorial, however need to rule out cardiac etiology first - Check chemistry, TSH today - Continue ASA, imdur, BB - Patient to contact [facility]iology to re-schedule f/u and work-up, and if need new referral contact our office, advised when to go to ED if worsening or acute symptoms

## 2017-02-05 NOTE — Progress Notes (Signed)
Subjective:    Patient ID: Charles Shepard, male    DOB: 09-Dec-1960, 56 y.o.   MRN: [id number]  Charles Shepard is a 56 y.o. male presenting on 02/05/2017 for Fatigue (wants referral  past Hx of chest pressure but now gets exhausted and have no energy also need blood work for lipid panel pt is fasting )   HPI    Exertional Fatigue / Recurrent Chest Pain - Today presents to follow-up on this persistent issue, he was initially seen for similar problem by prior PCP in 03/2016, he was referred to Cardiology at that time, but it took him a while to get in to be seen due to variety of reasons that patient was unavailable out on work and unable to schedule when in town. He was seen by [facility] Dr Lorenza Chick in 06/2016 for similar chest pain symptoms, and again referred to [facility]. He did establish with Dr Juliann Pares on 06/23/16, concerns for new onset possible unstable angina at the time, he was recommended to do exercise myoview assessment, and ECHO. He was treated with aspirin, imdur, and metoprolol. However patient seems to have been lost to follow-up, he states he was never able to return. - Now requesting repeat referral back to Cardiology for testing as above, he has not tried contacting [facility] yet - Describes symptoms now with easier fatigue on exertion, states feels "really tired really fast", some moderately strenuous work he is often active moving and changing positions, not doing heavy lifting, but he will work for up to 3 hours then need break for 20 min or so to rest, seems to be gradually worsening over past few months. - Additionally with dull mid-left chest pain, exertional, improves with rest, few times a week, rarely had episode mixed with sweating and nausea - He states fatigue may be worse if eats certain things, worst if eats greasy, fatty, fried foods, heavier meal makes it worse - Taking ASA  daily - Denies dyspnea, pre-syncope or syncope, headache, abdominal pain,  heartburn, vomiting  CHRONIC HTN: Reports no concerns, does not check BP regularly Current Meds - Metoprolol XL  daily, Imdur    Reports good compliance, took meds today. Tolerating well, w/o complaints.  HYPERLIPIDEMIA: - Previously took statin in past with Lipitor, then changed several times (3-4?), he was eventually put on Simvastatin. No recent lipid results, perhaps >2016 available. - Today requesting fasting blood work since he is here and wants to check on cholesterol, will go to [facility]  Follow-up Left Abdominal Wall Abrasion - Reports new question about old injury, in December 2017, he crawled under basement of sister's house and he injured his left abdomen/flank on a "rusty piece of metal", had a scratch through a shirt, and some bruising. He took a picture of it few days later with localized yellow-green ecchymosis appearance, it resolved without other treatment, now just has scar, denies pain  PRURITUS ANI: - Last visit with me 09/2016 for same issue, see background information - He was dx with pruritus ani and chronic anal fissure, no evidence of hemorrhoids or other etiology, he was given triamcinolone 0.025% cream to use for flare - Today reports still persistent problem, but it did resolve with the cream, but it did recur, and seems to respond very well to cream, even after one dose, but does not "resolve". Still has itching intermittently. He did not use cream for 2 months. 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  Dyslipidemia   . GERD (gastroesophageal reflux disease)    Social History   Social History  . Marital status: Married    Spouse name: N/A  . Number of children: N/A  . Years of education: N/A   Occupational History  . Facilities manager out of state often to [employer]   Social History Main Topics  . Smoking status: Former Games developer  . Smokeless tobacco: Former Neurosurgeon  . Alcohol use 3.6 oz/week    6 Cans of beer per week  . Drug use: No  . Sexual activity: Not Asked   Other Topics Concern  . None   Social History Narrative  . None   Past Surgical History:  Procedure Laterality Date  . APPENDECTOMY    . broken leg  1999   Left, has a rod and a screw from ankle to knee  . CERVICAL DISCECTOMY  2008   c-5   Family History  Problem Relation Age of Onset  . Hyperlipidemia Mother   . Diabetes Paternal Aunt      Current Outpatient Prescriptions (Cardiovascular):  .  simvastatin (ZOCOR) 20 MG tablet, TAKE 1 TABLET (20 MG TOTAL) BY MOUTH AT BEDTIME. .  isosorbide mononitrate (IMDUR) 30 MG 24 hr tablet, Take 30 mg by mouth daily. .  metoprolol succinate (TOPROL-XL) 25 MG 24 hr tablet, Take by mouth.   Current Outpatient Prescriptions (Analgesics):  .  aspirin EC 81 MG tablet, Take 81 mg by mouth daily.   Current Outpatient Prescriptions (Other):  .  dexlansoprazole (DEXILANT) 60 MG capsule, Take 1 capsule (60 mg total) by mouth daily. Marland Kitchen  triamcinolone (KENALOG) 0.025 % cream, Apply 1 application topically 2 (two) times daily. To affected area for 2 weeks then stop.  Review of Systems Per HPI unless specifically indicated above     Objective:    BP 113/84   Pulse 62   Temp 98.1 F (36.7 C) (Oral)   Resp 16   Ht  (1.778 m)   Wt 189 lb (85.7 kg)   BMI 27.12 kg/m   Wt Readings from Last 3 Encounters:  02/05/17 189 lb (85.7 kg)  09/25/16 195 lb (88.5 kg)  05/11/16 195 lb (88.5 kg)    Physical Exam  Constitutional: He  is oriented to person, place, and time. He appears well-developed and well-nourished. No distress.  Well-appearing, comfortable, cooperative  HENT:  Head: Normocephalic and atraumatic.  Mouth/Throat: Oropharynx is clear and moist.  Eyes: Conjunctivae are normal.  Neck: Normal range of motion. Neck supple.  Cardiovascular: Normal rate, regular rhythm, normal heart sounds and intact distal pulses.   No murmur heard. Pulmonary/Chest: Effort normal and breath sounds normal. No respiratory distress. He has no wheezes. He has no rales. He exhibits no tenderness.  Abdominal: Soft.  Left lower abdominal wall / flank - Well healed scar tissue linear few cm, non tender, no color change, ecchymosis, erythema, palpable mass or abnormality  Genitourinary:  Genitourinary Comments: Did not repeat external rectal or DRE exam today.  See rectal exam at last visit for recent findings.  Neurological: He is alert and oriented to person, place, and time.  Skin: Skin is warm and dry. He is not diaphoretic.  Psychiatric: His behavior is normal.  Nursing note and vitals reviewed.   Results for orders placed or performed during the hospital encounter of 04/03/16  CBC with Differential  Result Value Ref Range   WBC 6.5 3.8 - 10.6 K/uL   RBC 4.82 4.40 - 5.90 MIL/uL   Hemoglobin 15.6 13.0 - 18.0 g/dL   HCT 16.1 09.6 - 04.5 %   MCV 93.9 80.0 - 100.0 fL   MCH 32.4 26.0 - 34.0 pg   MCHC 34.5 32.0 - 36.0 g/dL   RDW 40.9 81.1 - 91.4 %   Platelets 217 150 - 440 K/uL   Neutrophils Relative % 53 %   Neutro Abs 3.5 1.4 - 6.5 K/uL   Lymphocytes Relative 32 %   Lymphs Abs 2.1 1.0 - 3.6 K/uL   Monocytes Relative 11 %   Monocytes Absolute 0.7 0.2 - 1.0 K/uL   Eosinophils Relative 3 %   Eosinophils Absolute 0.2 0 - 0.7 K/uL   Basophils Relative 1 %   Basophils Absolute 0.0 0 - 0.1 K/uL  Basic metabolic panel  Result Value Ref Range   Sodium 140 135 - 145 mmol/L   Potassium 3.9 3.5 - 5.1 mmol/L   Chloride 109 101  - 111 mmol/L   CO2 25 22 - 32 mmol/L   Glucose, Bld 92 65 - 99 mg/dL   BUN 17 6 - 20 mg/dL   Creatinine, Ser 7.82 0.61 - 1.24 mg/dL   Calcium 9.0 8.9 - 95.6 mg/dL   GFR calc non Af Amer >60 >60 mL/min   GFR calc Af Amer >60 >60 mL/min   Anion gap 6 5 - 15      Assessment & Plan:   Problem List Items Addressed This Visit    Pruritus ani    Stable to interval improvement, but has recurrence. Did not re-examine today. Previous rash concerning for possible other derm etiology such as inverse psoriasis. 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 Continue current topical steroid Triamcinolone 0.025% cream BID for up to 2 weeks per flare, can use as spot treatment since very effective so far. Discussion on prefer to avoid higher potency if this works. It may not cure it but next steps would be Dermatology for additional management, he wants to hold on referral for now 2. Follow-up as needed      Fatigue    Chronic worsening problem, with constellation of symptoms, concern for angina as previously evaluated by Cardiology in 2017, however patient was lost to follow-up did not get stress test ECHO etc. - suspect still may be multifactorial, however need to rule out cardiac etiology first - Check chemistry, TSH today - Patient to contact [facility] Cardiology to re-schedule f/u and work-up, and if need new referral contact our office, advised when to go to ED if worsening or acute symptoms      Relevant Orders   TSH   Comprehensive metabolic panel   Dyslipidemia - Primary    Check fasting lipids today, [facility] Continue Simvastatin  daily Now concerns with possible anginal symptoms, previously lost to follow-up from cardiology, may need adjusted statin dose based on cholesterol results and cardiac findings Continue ASA 81      Relevant Orders   Lipid panel   TSH  Comprehensive metabolic panel   BP (high blood pressure)    Stable, controlled BP No known  complications  Plan: 1. Continue current meds 2. On BB, follow-up with Cardiology regarding anginal symptoms for further work-up 3. Check chemistry today      Relevant Medications   metoprolol succinate (TOPROL-XL) 25 MG 24 hr tablet   Other Relevant Orders   Comprehensive metabolic panel   Angina of effort (HCC)    Chronic worsening problem, with constellation of symptoms, concern for angina as previously evaluated by Cardiology in 2017, however patient was lost to follow-up did not get stress test ECHO etc. - suspect still may be multifactorial, however need to rule out cardiac etiology first - Check chemistry, TSH today - Continue ASA, imdur, BB - Patient to contact [facility]iology to re-schedule f/u and work-up, and if need new referral contact our office, advised when to go to ED if worsening or acute symptoms      Relevant Medications   metoprolol succinate (TOPROL-XL) 25 MG 24 hr tablet    Other Visit Diagnoses    Abnormal glucose       Relevant Orders   Hemoglobin A1c      Left Abdomen/Flank Scar, prior injury - Appears resolved. Picture from initial injury appears concerning, however he did not receive repeat TDap at the time, did not develop any concerning symptoms of tetanus, no evidence of infection, seems to have been more local bruising - Follow-up as needed, reassurance today now seems healed  Follow up plan: Return in about 9 months (around 11/07/2017) for Annual Physical.  Offered patient sooner follow-up, due to his work schedule out of state, he will return sooner if needed, but agrees to schedule visit with Cardiology as soon as possible and will return later this year for physical.  Saralyn Pilar, DO [facility] 02/05/2017, 12:22 PM

## 2017-02-05 NOTE — Assessment & Plan Note (Signed)
Stable to interval improvement, but has recurrence. Did not re-examine today. Previous rash concerning for possible other derm etiology such as inverse psoriasis. 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 Continue current topical steroid Triamcinolone 0.025% cream BID for up to 2 weeks per flare, can use as spot treatment since very effective so far. Discussion on prefer to avoid higher potency if this works. It may not cure it but next steps would be Dermatology for additional management, he wants to hold on referral for now 2. Follow-up as needed

## 2017-02-06 LAB — COMPREHENSIVE METABOLIC PANEL
A/G RATIO: 1.8 (ref 1.2–2.2)
ALT: 62 IU/L — AB (ref 0–44)
AST: 29 IU/L (ref 0–40)
Albumin: 4.6 g/dL (ref 3.5–5.5)
Alkaline Phosphatase: 72 IU/L (ref 39–117)
BILIRUBIN TOTAL: 0.6 mg/dL (ref 0.0–1.2)
BUN/Creatinine Ratio: 17 (ref 9–20)
BUN: 17 mg/dL (ref 6–24)
CHLORIDE: 104 mmol/L (ref 96–106)
CO2: 23 mmol/L (ref 18–29)
Calcium: 9.6 mg/dL (ref 8.7–10.2)
Creatinine, Ser: 1.03 mg/dL (ref 0.76–1.27)
GFR calc non Af Amer: 82 mL/min/{1.73_m2} (ref >59)
GFR, EST AFRICAN AMERICAN: 95 mL/min/{1.73_m2} (ref >59)
Globulin, Total: 2.6 g/dL (ref 1.5–4.5)
Glucose: 99 mg/dL (ref 65–99)
POTASSIUM: 4.7 mmol/L (ref 3.5–5.2)
Sodium: 142 mmol/L (ref 134–144)
Total Protein: 7.2 g/dL (ref 6.0–8.5)

## 2017-02-06 LAB — LIPID PANEL
Chol/HDL Ratio: 5.8 ratio units — ABNORMAL HIGH (ref 0.0–5.0)
Cholesterol, Total: 214 mg/dL — ABNORMAL HIGH (ref 100–199)
HDL: 37 mg/dL — ABNORMAL LOW (ref >39)
LDL Calculated: 145 mg/dL — ABNORMAL HIGH (ref 0–99)
TRIGLYCERIDES: 159 mg/dL — AB (ref 0–149)
VLDL Cholesterol Cal: 32 mg/dL (ref 5–40)

## 2017-02-06 LAB — HEMOGLOBIN A1C
ESTIMATED AVERAGE GLUCOSE: 111 mg/dL
Hgb A1c MFr Bld: 5.5 % (ref 4.8–5.6)

## 2017-02-06 LAB — TSH: TSH: 0.8 u[IU]/mL (ref 0.450–4.500)

## 2017-02-08 ENCOUNTER — Encounter: Payer: Self-pay | Admitting: Family Medicine

## 2017-02-08 DIAGNOSIS — M71572 Other bursitis, not elsewhere classified, left ankle and foot: Secondary | ICD-10-CM | POA: Diagnosis not present

## 2017-02-08 DIAGNOSIS — M722 Plantar fascial fibromatosis: Secondary | ICD-10-CM | POA: Diagnosis not present

## 2017-02-08 MED ORDER — ROSUVASTATIN CALCIUM 20 MG PO TABS: 20.0000 mg | ORAL_TABLET | Freq: Every day | ORAL | 3 refills | Status: DC

## 2017-02-08 NOTE — Addendum Note (Signed)
Addended by: Smitty Cords on: 02/08/2017 04:51 PM   Modules accepted: Orders

## 2017-04-09 DIAGNOSIS — M71572 Other bursitis, not elsewhere classified, left ankle and foot: Secondary | ICD-10-CM | POA: Diagnosis not present

## 2017-04-09 DIAGNOSIS — M722 Plantar fascial fibromatosis: Secondary | ICD-10-CM | POA: Diagnosis not present

## 2017-04-26 DIAGNOSIS — M722 Plantar fascial fibromatosis: Secondary | ICD-10-CM | POA: Diagnosis not present

## 2017-04-26 DIAGNOSIS — M71572 Other bursitis, not elsewhere classified, left ankle and foot: Secondary | ICD-10-CM | POA: Diagnosis not present

## 2017-05-18 ENCOUNTER — Other Ambulatory Visit: Payer: Self-pay

## 2017-07-04 ENCOUNTER — Other Ambulatory Visit: Payer: Self-pay | Admitting: Family Medicine

## 2017-07-04 DIAGNOSIS — K449 Diaphragmatic hernia without obstruction or gangrene: Principal | ICD-10-CM

## 2017-07-04 DIAGNOSIS — K219 Gastro-esophageal reflux disease without esophagitis: Secondary | ICD-10-CM

## 2017-09-20 ENCOUNTER — Ambulatory Visit: Payer: 59 | Admitting: Family Medicine

## 2017-09-20 ENCOUNTER — Encounter: Payer: Self-pay | Admitting: Family Medicine

## 2017-09-20 VITALS — BP 124/80 | HR 64 | Temp 98.4°F | Resp 16 | Ht 70.0 in | Wt 188.0 lb

## 2017-09-20 DIAGNOSIS — E785 Hyperlipidemia, unspecified: Secondary | ICD-10-CM

## 2017-09-20 DIAGNOSIS — I10 Essential (primary) hypertension: Secondary | ICD-10-CM | POA: Diagnosis not present

## 2017-09-20 DIAGNOSIS — R51 Headache: Secondary | ICD-10-CM | POA: Diagnosis not present

## 2017-09-20 DIAGNOSIS — R799 Abnormal finding of blood chemistry, unspecified: Secondary | ICD-10-CM | POA: Diagnosis not present

## 2017-09-20 DIAGNOSIS — I208 Other forms of angina pectoris: Secondary | ICD-10-CM

## 2017-09-20 DIAGNOSIS — R519 Headache, unspecified: Secondary | ICD-10-CM

## 2017-09-20 MED ORDER — METOPROLOL SUCCINATE ER 25 MG PO TB24: 25.0000 mg | ORAL_TABLET | Freq: Every day | ORAL | 5 refills | Status: DC

## 2017-09-20 NOTE — Assessment & Plan Note (Signed)
Due for repeat lipids within 4 weeks, since off Statin for while to compare He was non adherent to rosuvastatin when rx in 02/2017, he did not get notified by [facility] and never knew to pick it up, despite our office contacting him Follow-up 4 weeks - may resume statin

## 2017-09-20 NOTE — Assessment & Plan Note (Signed)
Elevated BP today, improve on manual re-check Seems elevated BP may be related to headaches No longer on med BB or imdur  Plan: 1. Restart BB Metoprolol XL 25mg  daily - same as before, reassurance this should not cause his headaches, hopeful it may help reduce headache symptoms 2. [facility]
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
## 2017-09-20 NOTE — Patient Instructions (Addendum)
Thank you for coming to the clinic today.  1.  Last seen 06/2016 for initial consult, contact them to request re-schedule of follow-up. If you need a new referral, call our office back and we can order this.  [facility] ([facility]) [address] [place], [place]  [address] Phone: [phone]  Dwayne D. Callwood, MD  2. For headaches - Recommend to start taking (Acetaminophen) Tylenol Extra Strength 500mg  tabs - take 1 to 2 tabs per dose (max 1000mg ) every 6-8 hours for pain (take regularly, don't skip a dose for next 7 days), max 24 hour daily dose is 6 tablets or 3000mg . In the future you can repeat the same everyday Tylenol course for 1-2 weeks at a time.   May take Excedrin Migraine as needed for headache - STOP taking Aspirin 325mg  for headaches  Ibuprofen 200mg  x 3 to 4 pills for dose 600 to 800mg  as needed up to 8 hours as needed  3. Start Metoprolol XL 25mg  ONCE daily - should help headaches, and keep BP regular  DUE for FASTING BLOOD WORK (no food or drink after midnight before the lab appointment, only water or coffee without cream/sugar on the morning of)  SCHEDULE "Lab Only" visit in the morning at the clinic for lab draw in 4 WEEKS  - Make sure Lab Only appointment is at about 1 week before your next appointment, so that results will be available  For Lab Results, once available within 2-3 days of blood draw, you can can log in to MyChart online to view your results and a brief explanation. Also, we can discuss results at next follow-up visit.  If you have any significant chest pain that does not go away within 30 minutes, is accompanied by nausea, sweating, shortness of breath, or made worse by activity, this may be evidence of a heart attack, especially if symptoms worsening instead of improving, please call 911 or go directly to the emergency room immediately for evaluation.  Please schedule a Follow-up Appointment to: Return in about 4 weeks (around  10/18/2017) for Lab review, Headache follow-up, insomnia, HTN, CP (Cards).  If you have any other questions or concerns, please feel free to call the clinic or send a message through MyChart. You may also schedule an earlier appointment if necessary.  Additionally, you may be receiving a survey about your experience at our clinic within a few days to 1 week by e-mail or mail. We value your feedback.  Saralyn PilarAlexander Karamalegos, DO [facility]

## 2017-09-20 NOTE — Assessment & Plan Note (Signed)
Unspecified at this time, difficult to determine etiology given long gaps in care and limited follow-up with other comorbid conditions such as exertional chest pain, and non adherence to medications. - Admittedly poor lifestyle with diet, no exercise may be factor in overall health as well - Not clinically suspicious for migraines - Possibly related to elevated BP, could be rebound HA as well  Plan: 1. Start back on BB Metoprolol XL 25mg  daily - may also reduce frequency of migraine 2. Start on organized OTC med system - STOP ASA 325mg  x 1-2 per dose BID, start with regular Tylenol 1000mg  TID for now, then next line would be add PRN Excedrin migraine, and then 3rd line is add Ibuprofen 600-800 OTC PRN q 6-8 hours 3. Keep headache diary and journal course 4. Check labs within 4 weeks 5. Follow-up with Cardiology HTN CP 6. Follow-up with me within 4 weeks, return precautions reviewed - consider MRI at request of patient's wife, however not indicated today, he has had prior imaging will review with them

## 2017-09-20 NOTE — Assessment & Plan Note (Signed)
Still present, chronic worsening problem, with constellation of symptoms, concern for angina as previously evaluated by Cardiology in 2017, however patient was lost to follow-up did not get stress test ECHO etc - Patient was lost to follow-up to myself and Cardiology for 8 more months, he did not contact them to schedule as advised - suspect still may be multifactorial, however need to rule out cardiac etiology first. Seems may be related to BP, also some GERD component - Negative labs from prior visit, nml TSH, chemstiry - Self discontinued Imdur, BB  Plan: 1. Again strongly recommended that [facility] to [facility] to re-schedule f/u and work-up, and if need new referral contact our office, advised when to go to ED if worsening or acute symptoms 2. Restarted Metoprolol, holding imdur likely cause of headache

## 2017-09-21 DIAGNOSIS — R0602 Shortness of breath: Secondary | ICD-10-CM | POA: Diagnosis not present

## 2017-09-21 DIAGNOSIS — I208 Other forms of angina pectoris: Secondary | ICD-10-CM | POA: Diagnosis not present

## 2017-09-21 DIAGNOSIS — R011 Cardiac murmur, unspecified: Secondary | ICD-10-CM | POA: Diagnosis not present

## 2017-09-21 NOTE — Addendum Note (Signed)
Addended by: Elvina MattesPATEL, Katrenia Alkins D on: 09/21/2017 03:02 PM   Modules accepted: Orders

## 2017-09-27 DIAGNOSIS — I10 Essential (primary) hypertension: Secondary | ICD-10-CM | POA: Diagnosis not present

## 2017-10-08 DIAGNOSIS — R0602 Shortness of breath: Secondary | ICD-10-CM | POA: Diagnosis not present

## 2017-10-08 DIAGNOSIS — R011 Cardiac murmur, unspecified: Secondary | ICD-10-CM | POA: Diagnosis not present

## 2017-10-08 DIAGNOSIS — I208 Other forms of angina pectoris: Secondary | ICD-10-CM | POA: Diagnosis not present

## 2017-10-19 DIAGNOSIS — I10 Essential (primary) hypertension: Secondary | ICD-10-CM | POA: Diagnosis not present

## 2017-10-19 DIAGNOSIS — E538 Deficiency of other specified B group vitamins: Secondary | ICD-10-CM | POA: Diagnosis not present

## 2017-10-19 DIAGNOSIS — R011 Cardiac murmur, unspecified: Secondary | ICD-10-CM | POA: Diagnosis not present

## 2017-10-19 DIAGNOSIS — R0602 Shortness of breath: Secondary | ICD-10-CM | POA: Diagnosis not present

## 2017-10-19 DIAGNOSIS — G44099 Other trigeminal autonomic cephalgias (TAC), not intractable: Secondary | ICD-10-CM | POA: Diagnosis not present

## 2017-10-19 DIAGNOSIS — E559 Vitamin D deficiency, unspecified: Secondary | ICD-10-CM | POA: Diagnosis not present

## 2017-10-28 ENCOUNTER — Other Ambulatory Visit: Payer: Self-pay | Admitting: Neurology

## 2017-10-28 DIAGNOSIS — G44099 Other trigeminal autonomic cephalgias (TAC), not intractable: Secondary | ICD-10-CM

## 2017-11-05 ENCOUNTER — Other Ambulatory Visit: Payer: Self-pay | Admitting: Neurology

## 2017-11-05 DIAGNOSIS — Z1389 Encounter for screening for other disorder: Secondary | ICD-10-CM

## 2017-11-08 ENCOUNTER — Ambulatory Visit
Admission: RE | Admit: 2017-11-08 | Discharge: 2017-11-08 | Disposition: A | Discharge status: Home / Self Care | Payer: Commercial Managed Care - HMO | Source: Ambulatory Visit | Attending: Neurology | Admitting: Neurology

## 2017-11-08 DIAGNOSIS — Z1389 Encounter for screening for other disorder: Secondary | ICD-10-CM

## 2017-11-08 DIAGNOSIS — Z0389 Encounter for observation for other suspected diseases and conditions ruled out: Secondary | ICD-10-CM | POA: Diagnosis not present

## 2017-11-08 DIAGNOSIS — M4802 Spinal stenosis, cervical region: Secondary | ICD-10-CM | POA: Insufficient documentation

## 2017-11-08 DIAGNOSIS — Z135 Encounter for screening for eye and ear disorders: Secondary | ICD-10-CM | POA: Insufficient documentation

## 2017-11-08 DIAGNOSIS — G44099 Other trigeminal autonomic cephalgias (TAC), not intractable: Secondary | ICD-10-CM | POA: Diagnosis not present

## 2017-11-08 DIAGNOSIS — R51 Headache: Secondary | ICD-10-CM | POA: Diagnosis not present

## 2017-11-08 DIAGNOSIS — Z01818 Encounter for other preprocedural examination: Secondary | ICD-10-CM | POA: Diagnosis not present

## 2017-11-08 MED ORDER — GADOBENATE DIMEGLUMINE 529 MG/ML IV SOLN
17.0000 mL | Freq: Once | INTRAVENOUS | Status: AC | PRN
  Administered 2017-11-08: 17 mL via INTRAVENOUS

## 2017-11-26 DIAGNOSIS — M542 Cervicalgia: Secondary | ICD-10-CM | POA: Diagnosis not present

## 2017-11-26 DIAGNOSIS — I10 Essential (primary) hypertension: Secondary | ICD-10-CM | POA: Diagnosis not present

## 2017-12-01 ENCOUNTER — Other Ambulatory Visit: Payer: Self-pay | Admitting: Neurological Surgery

## 2017-12-01 DIAGNOSIS — M542 Cervicalgia: Secondary | ICD-10-CM

## 2017-12-10 ENCOUNTER — Ambulatory Visit
Admission: RE | Admit: 2017-12-10 | Discharge: 2017-12-10 | Disposition: A | Discharge status: Home / Self Care | Payer: Commercial Managed Care - HMO | Source: Ambulatory Visit | Attending: Neurological Surgery | Admitting: Neurological Surgery

## 2017-12-10 DIAGNOSIS — M542 Cervicalgia: Secondary | ICD-10-CM | POA: Diagnosis not present

## 2017-12-10 DIAGNOSIS — Z981 Arthrodesis status: Secondary | ICD-10-CM | POA: Insufficient documentation

## 2017-12-10 DIAGNOSIS — M4802 Spinal stenosis, cervical region: Secondary | ICD-10-CM | POA: Diagnosis not present

## 2017-12-10 DIAGNOSIS — M2578 Osteophyte, vertebrae: Secondary | ICD-10-CM | POA: Diagnosis not present

## 2017-12-17 DIAGNOSIS — E559 Vitamin D deficiency, unspecified: Secondary | ICD-10-CM | POA: Diagnosis not present

## 2017-12-17 DIAGNOSIS — G44099 Other trigeminal autonomic cephalgias (TAC), not intractable: Secondary | ICD-10-CM | POA: Diagnosis not present

## 2018-01-03 DIAGNOSIS — I10 Essential (primary) hypertension: Secondary | ICD-10-CM | POA: Diagnosis not present

## 2018-01-03 DIAGNOSIS — M542 Cervicalgia: Secondary | ICD-10-CM | POA: Diagnosis not present

## 2018-01-06 ENCOUNTER — Telehealth: Payer: Self-pay | Admitting: Family Medicine

## 2018-01-06 DIAGNOSIS — K219 Gastro-esophageal reflux disease without esophagitis: Secondary | ICD-10-CM

## 2018-01-06 DIAGNOSIS — K449 Diaphragmatic hernia without obstruction or gangrene: Principal | ICD-10-CM

## 2018-01-06 NOTE — Telephone Encounter (Signed)
[place] [facility] [place] He has been seen there before, history of GERD hiatal hernia, on Dexilant. Has had prior EGD.  Saralyn PilarAlexander Agape Hardiman, DO [facility] 01/06/2018, 12:37 PM

## 2018-01-06 NOTE — Telephone Encounter (Signed)
Pt asked for referral to Dr. Servando SnareWohl.  He said he saw Dr. Servando SnareWohl for the same issue about 2 years ago.  His call back number is [phone]

## 2018-01-19 ENCOUNTER — Other Ambulatory Visit: Payer: Self-pay

## 2018-01-19 DIAGNOSIS — I2089 Other forms of angina pectoris: Secondary | ICD-10-CM

## 2018-01-19 DIAGNOSIS — R799 Abnormal finding of blood chemistry, unspecified: Secondary | ICD-10-CM

## 2018-01-19 DIAGNOSIS — R51 Headache: Secondary | ICD-10-CM

## 2018-01-19 DIAGNOSIS — R519 Headache, unspecified: Secondary | ICD-10-CM

## 2018-01-19 DIAGNOSIS — R739 Hyperglycemia, unspecified: Secondary | ICD-10-CM

## 2018-01-19 DIAGNOSIS — I208 Other forms of angina pectoris: Secondary | ICD-10-CM

## 2018-01-20 DIAGNOSIS — R739 Hyperglycemia, unspecified: Secondary | ICD-10-CM | POA: Diagnosis not present

## 2018-01-20 DIAGNOSIS — R51 Headache: Secondary | ICD-10-CM | POA: Diagnosis not present

## 2018-01-20 DIAGNOSIS — R799 Abnormal finding of blood chemistry, unspecified: Secondary | ICD-10-CM | POA: Diagnosis not present

## 2018-01-20 DIAGNOSIS — I208 Other forms of angina pectoris: Secondary | ICD-10-CM | POA: Diagnosis not present

## 2018-01-21 LAB — CBC WITH DIFFERENTIAL/PLATELET
Basophils Absolute: 0 10*3/uL (ref 0.0–0.2)
Basos: 0 %
EOS (ABSOLUTE): 0.1 10*3/uL (ref 0.0–0.4)
EOS: 3 %
HEMATOCRIT: 46.2 % (ref 37.5–51.0)
HEMOGLOBIN: 16.1 g/dL (ref 13.0–17.7)
IMMATURE GRANULOCYTES: 0 %
Immature Grans (Abs): 0 10*3/uL (ref 0.0–0.1)
Lymphocytes Absolute: 1.3 10*3/uL (ref 0.7–3.1)
Lymphs: 27 %
MCH: 32.5 pg (ref 26.6–33.0)
MCHC: 34.8 g/dL (ref 31.5–35.7)
MCV: 93 fL (ref 79–97)
MONOCYTES: 9 %
Monocytes Absolute: 0.4 10*3/uL (ref 0.1–0.9)
Neutrophils Absolute: 2.8 10*3/uL (ref 1.4–7.0)
Neutrophils: 61 %
Platelets: 225 10*3/uL (ref 150–379)
RBC: 4.95 x10E6/uL (ref 4.14–5.80)
RDW: 13.6 % (ref 12.3–15.4)
WBC: 4.7 10*3/uL (ref 3.4–10.8)

## 2018-01-21 LAB — LIPID PANEL WITH LDL/HDL RATIO
Cholesterol, Total: 271 mg/dL — ABNORMAL HIGH (ref 100–199)
HDL: 34 mg/dL — ABNORMAL LOW (ref >39)
LDL Calculated: 181 mg/dL — ABNORMAL HIGH (ref 0–99)
LDL/HDL RATIO: 5.3 ratio — AB (ref 0.0–3.6)
Triglycerides: 278 mg/dL — ABNORMAL HIGH (ref 0–149)
VLDL CHOLESTEROL CAL: 56 mg/dL — AB (ref 5–40)

## 2018-01-21 LAB — COMPREHENSIVE METABOLIC PANEL
ALT: 32 IU/L (ref 0–44)
AST: 22 IU/L (ref 0–40)
Albumin/Globulin Ratio: 2 (ref 1.2–2.2)
Albumin: 4.6 g/dL (ref 3.5–5.5)
Alkaline Phosphatase: 79 IU/L (ref 39–117)
BUN/Creatinine Ratio: 16 (ref 9–20)
BUN: 16 mg/dL (ref 6–24)
Bilirubin Total: 0.5 mg/dL (ref 0.0–1.2)
CALCIUM: 9.6 mg/dL (ref 8.7–10.2)
CO2: 25 mmol/L (ref 20–29)
CREATININE: 0.97 mg/dL (ref 0.76–1.27)
Chloride: 104 mmol/L (ref 96–106)
GFR, EST AFRICAN AMERICAN: 101 mL/min/{1.73_m2} (ref >59)
GFR, EST NON AFRICAN AMERICAN: 88 mL/min/{1.73_m2} (ref >59)
GLUCOSE: 83 mg/dL (ref 65–99)
Globulin, Total: 2.3 g/dL (ref 1.5–4.5)
Potassium: 4.4 mmol/L (ref 3.5–5.2)
Sodium: 143 mmol/L (ref 134–144)
Total Protein: 6.9 g/dL (ref 6.0–8.5)

## 2018-01-21 LAB — HEMOGLOBIN A1C
ESTIMATED AVERAGE GLUCOSE: 111 mg/dL
Hgb A1c MFr Bld: 5.5 % (ref 4.8–5.6)

## 2018-01-24 ENCOUNTER — Telehealth: Payer: Self-pay

## 2018-01-24 DIAGNOSIS — E785 Hyperlipidemia, unspecified: Secondary | ICD-10-CM

## 2018-01-24 MED ORDER — ROSUVASTATIN CALCIUM 20 MG PO TABS: 20.0000 mg | ORAL_TABLET | Freq: Every day | ORAL | 3 refills | Status: DC

## 2018-01-24 NOTE — Telephone Encounter (Signed)
Should the pt start back on his Rosuvastatin and if so at the same dosage? Please advise

## 2018-01-24 NOTE — Addendum Note (Signed)
Addended by: Smitty CordsKARAMALEGOS, Trinitey Roache J on: 01/24/2018 12:00 PM   Modules accepted: Orders

## 2018-01-24 NOTE — Telephone Encounter (Signed)
The pt was notified of lab results no, questions or concerns.

## 2018-01-24 NOTE — Telephone Encounter (Signed)
Attempted to contact pt, no answer. I left a message on the pt vm that he can resume medication as previously discussed and the prescription was sent over to his pharmacy.

## 2018-01-24 NOTE — Telephone Encounter (Signed)
-----   Message from Smitty CordsAlexander J Karamalegos, DO sent at 01/23/2018 11:13 PM EDT ----- Please contact patient to review the following (No MyChart Access):  1. Chemistry - Normal results, including electrolytes, sugar, kidney and liver function.   2. Hemoglobin A1c (Diabetes screening) - 5.5, normal not in range of Pre-Diabetes (>5.7 to 6.4)   3. Cholesterol - Now off rosuvastatin 20mg  - elevated Triglycerides 159 to 278, HDL stable 34, LDL up from 145 to 181.  4. CBC Blood Counts - Normal, no anemia, other abnormality  Saralyn PilarAlexander Karamalegos, DO [facility] 01/23/2018, 11:13 PM

## 2018-01-24 NOTE — Telephone Encounter (Signed)
Please notify patient of following:  I have re-sent rx Rosuvastatin 20mg  daily to pharmacy as we had planned previous. His lipid results below show he will benefit from Statin.  I am unsure why he was unable to pick it up before. He should notify us if any further problems with rx.  Saralyn PilarAlexander Karamalegos, DO [facility] 01/24/2018, 11:59 AM

## 2018-02-05 ENCOUNTER — Other Ambulatory Visit: Payer: Self-pay | Admitting: Family Medicine

## 2018-02-05 DIAGNOSIS — K219 Gastro-esophageal reflux disease without esophagitis: Secondary | ICD-10-CM

## 2018-02-05 DIAGNOSIS — K449 Diaphragmatic hernia without obstruction or gangrene: Principal | ICD-10-CM

## 2018-02-16 ENCOUNTER — Ambulatory Visit: Payer: 59 | Admitting: Gastroenterology

## 2018-02-16 ENCOUNTER — Encounter (INDEPENDENT_AMBULATORY_CARE_PROVIDER_SITE_OTHER): Payer: Self-pay

## 2018-02-16 ENCOUNTER — Encounter: Payer: Self-pay | Admitting: Gastroenterology

## 2018-02-16 ENCOUNTER — Other Ambulatory Visit: Payer: Self-pay

## 2018-02-16 VITALS — BP 145/82 | HR 62 | Ht 70.0 in | Wt 196.5 lb

## 2018-02-16 DIAGNOSIS — K602 Anal fissure, unspecified: Secondary | ICD-10-CM

## 2018-02-16 DIAGNOSIS — K625 Hemorrhage of anus and rectum: Secondary | ICD-10-CM | POA: Diagnosis not present

## 2018-02-16 NOTE — Progress Notes (Signed)
Gastroenterology Consultation  Referring Provider:     Saralyn PilarKaramalegos, Alexander * Primary Care Physician:  Smitty CordsKaramalegos, Alexander J, DO Primary Gastroenterologist:  Dr. Servando SnareWohl     Reason for Consultation:     Rectal itching and rectal bleeding        HPI:   Charles Shepard is a 57 y.o. y/o male referred for consultation & management of rectal itching and rectal bleeding by Dr. Althea CharonKaramalegos, Netta NeatAlexander J, DO.  This patient comes in today with a history of rectal itching and rectal bleeding.  The patient states that the itching and bleeding usually happen after he had a hard stool.  The patient also states that he has stools that have some bright red blood painted on them and will have a small amount of blood when he wipes.  The patient had a colonoscopy in 2014 that was reported to be normal.  The patient denies any unexplained weight loss fevers chills nausea or vomiting.  The patient did try some steroid cream without much relief.  He also states that he has intermittent hard stools.  There is no report of episodes like this in the past.  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  The patient states that his rectal pain is usually not while he is having a bowel movement but sometime afterward.  Past Medical History:  Diagnosis Date  . Dyslipidemia   . GERD (gastroesophageal reflux disease)     Past Surgical History:  Procedure Laterality Date  . APPENDECTOMY    . broken leg  1999   Left, has a rod and a screw from ankle to knee  . CERVICAL DISCECTOMY  2008   c-5    Prior to Admission medications   Medication Sig Start Date End Date Taking? Authorizing Provider  amLODipine (NORVASC) 5 MG tablet Take 5 mg by mouth daily. 01/19/18  Yes [provider]  DEXILANT 60 MG capsule TAKE 1 CAPSULE (60 MG TOTAL) BY MOUTH DAILY. 02/05/18  Yes Karamalegos, Netta NeatAlexander J, DO  gabapentin (NEURONTIN) 300 MG capsule TAKE 1 CAPSULE BY MOUTH EVERY DAY AT NIGHT  01/19/18  Yes [provider]  rosuvastatin (CRESTOR) 20 MG tablet Take 1 tablet (20 mg total) by mouth daily. 01/24/18  Yes Karamalegos, Netta NeatAlexander J, DO  triamcinolone (KENALOG) 0.025 % cream Apply 1 application topically 2 (two) times daily. To affected area for 2 weeks then stop. 09/25/16  Yes Karamalegos, Netta NeatAlexander J, DO  isosorbide mononitrate (IMDUR) 30 MG 24 hr tablet Take 30 mg by mouth daily. 06/23/16   [provider]  metoprolol succinate (TOPROL-XL) 25 MG 24 hr tablet Take 1 tablet (25 mg total) daily by mouth. Patient not taking: Reported on 02/16/2018 09/20/17   Smitty CordsKaramalegos, Alexander J, DO    Family History  Problem Relation Age of Onset  . Hyperlipidemia Mother   . Diabetes Paternal Aunt      Social History   Tobacco Use  . Smoking status: Former Games developermoker  . Smokeless tobacco: Former Engineer, waterUser  Substance Use Topics  . Alcohol use: Yes    Alcohol/week: 3.6 oz    Types: 6 Cans of beer per week  . Drug use: No    Allergies as of 02/16/2018  . (No Known Allergies)    Review of Systems:    All systems reviewed and negative except where noted in HPI.   Physical Exam:  BP (!) 145/82   Pulse 62   Ht 5'  10" (1.778 m)   Wt 196 lb 8 oz (89.1 kg)   BMI 28.19 kg/m  No LMP for male patient. Psych:  Alert and cooperative. Normal mood and affect. General:   Alert,  Well-developed, well-nourished, pleasant and cooperative in NAD Head:  Normocephalic and atraumatic. Eyes:  Sclera clear, no icterus.   Conjunctiva pink. Rectal:  Deferred.  Msk:  Symmetrical without gross deformities.  Good, equal movement & strength bilaterally. Extremities:  No clubbing or edema.  No cyanosis. Neurologic:  Alert and oriented x3;  grossly normal neurologically. Skin:  Intact without significant lesions or rashes.  No jaundice. Psych:  Alert and cooperative. Normal mood and affect.  Imaging Studies: No results found.  Assessment and Plan:   Charles Shepard is a 57 y.o. y/o  male who comes in with rectal bleeding and rectal itching.  The patient has been told to use non-scented nonalcoholic baby wipes to clean after he uses bathroom.  The patient has also been told to keep the area dry.  He will start to take Citrucel once to twice a day to help bulk up his stool so they are not as soft and pasty as he reports them to be.  The patient has been told that if his bleeding does not stop that he should contact my office and he may need to be set up for repeat colonoscopy.  The patient has been explained the plan and agrees with it.  Midge Minium, MD. [name]   Note: This dictation was prepared with Dragon dictation along with smaller phrase technology. Any transcriptional errors that result from this process are unintentional.

## 2018-06-21 ENCOUNTER — Other Ambulatory Visit: Payer: Self-pay | Admitting: Family Medicine

## 2018-06-21 DIAGNOSIS — K219 Gastro-esophageal reflux disease without esophagitis: Secondary | ICD-10-CM

## 2018-06-21 DIAGNOSIS — K449 Diaphragmatic hernia without obstruction or gangrene: Principal | ICD-10-CM

## 2018-08-12 ENCOUNTER — Other Ambulatory Visit: Payer: Self-pay

## 2018-08-12 ENCOUNTER — Encounter: Payer: Self-pay | Admitting: Nurse Practitioner

## 2018-08-12 ENCOUNTER — Ambulatory Visit: Payer: 59 | Admitting: Nurse Practitioner

## 2018-08-12 VITALS — BP 113/64 | HR 70 | Temp 98.3°F | Ht 70.0 in | Wt 195.4 lb

## 2018-08-12 DIAGNOSIS — R109 Unspecified abdominal pain: Secondary | ICD-10-CM | POA: Diagnosis not present

## 2018-08-12 DIAGNOSIS — R1032 Left lower quadrant pain: Secondary | ICD-10-CM | POA: Diagnosis not present

## 2018-08-12 LAB — POCT URINALYSIS DIPSTICK
Bilirubin, UA: NEGATIVE
Glucose, UA: NEGATIVE
Ketones, UA: NEGATIVE
Leukocytes, UA: NEGATIVE
Nitrite, UA: NEGATIVE
Protein, UA: NEGATIVE
Spec Grav, UA: >=1.03 — AB (ref 1.010–1.025)
Urobilinogen, UA: 0.2 E.U./dL
pH, UA: 5 (ref 5.0–8.0)

## 2018-08-12 MED ORDER — CIPROFLOXACIN HCL 500 MG PO TABS: 500.0000 mg | ORAL_TABLET | Freq: Two times a day (BID) | ORAL | 0 refills | Status: AC

## 2018-08-12 MED ORDER — METRONIDAZOLE 500 MG PO TABS: 500.0000 mg | ORAL_TABLET | Freq: Three times a day (TID) | ORAL | 0 refills | Status: AC

## 2018-08-12 NOTE — Patient Instructions (Addendum)
Charles Shepard,   Thank you for coming in to clinic today.  1. Once your pain is gone: - START 1/4-1/2 dose of metamucil daily.   2. Abdominal pain is likely diverticulitis (see handout below) - Treat this or other intestine infections with the same antibiotics.  If symptoms worsen, you develop fever/chills/or sweats, start your antibiotics.   - You will also start these if you hear back from me. - AVOID ALL ALCOHOL on antibiotics for the full duration of medication.  7 DAYS  - INCREASE water intake to 64-72 oz per day. (1/2 gallon +)  Drink more if you have had alcohol.  Antibiotics:  - START ciprofloxacin 500 mg twice daily for 5 days. - START metronidazole 500 mg three times daily for 7 days.  Please schedule a follow-up appointment with Wilhelmina Mcardle, AGNP. Return 7-10 days if symptoms worsen or fail to improve.  If you have any other questions or concerns, please feel free to call the clinic or send a message through MyChart. You may also schedule an earlier appointment if necessary.  You will receive a survey after today's visit either digitally by e-mail or paper by [employer]. Your experiences and feedback matter to [place].  Please respond so we know how we are doing as we provide care for you.   Wilhelmina Mcardle, DNP, AGNP-BC Adult Gerontology Nurse Practitioner [facility], [facility]   Diverticulitis Diverticulitis is when small pockets in your large intestine (colon) get infected or swollen. This causes stomach pain and watery poop (diarrhea). These pouches are called diverticula. They form in people who have a condition called diverticulosis. Follow these instructions at home: Medicines  Take over-the-counter and prescription medicines only as told by your doctor. These include: ? Antibiotics. ? Pain medicines. ? Fiber pills. ? Probiotics. ? Stool softeners.  Do not drive or use heavy machinery while taking prescription pain medicine.  If you were  prescribed an antibiotic, take it as told. Do not stop taking it even if you feel better. General instructions  Follow a diet as told by your doctor.  When you feel better, your doctor may tell you to change your diet. You may need to eat a lot of fiber. Fiber makes it easier to poop (have bowel movements). Healthy foods with fiber include: ? Berries. ? Beans. ? Lentils. ? Green vegetables.  Exercise 3 or more times a week. Aim for 30 minutes each time. Exercise enough to sweat and make your heart beat faster.  Keep all follow-up visits as told. This is important. You may need to have an exam of the large intestine. This is called a colonoscopy. Contact a doctor if:  Your pain does not get better.  You have a hard time eating or drinking.  You are not pooping like normal. Get help right away if:  Your pain gets worse.  Your problems do not get better.  Your problems get worse very fast.  You have a fever.  You throw up (vomit) more than one time.  You have poop that is: ? Bloody. ? Black. ? Tarry. Summary  Diverticulitis is when small pockets in your large intestine (colon) get infected or swollen.  Take medicines only as told by your doctor.  Follow a diet as told by your doctor. This information is not intended to replace advice given to you by your health care provider. Make sure you discuss any questions you have with your health care provider. Document Released: 04/13/2008 Document  Revised: 11/12/2016 Document Reviewed: 11/12/2016 Elsevier Interactive Patient Education  2017 [employer].

## 2018-08-12 NOTE — Progress Notes (Signed)
Subjective:    Patient ID: Charles Shepard, male    DOB: December 11, 1960, 57 y.o.   MRN: [id number]  Charles Shepard is a 57 y.o. male presenting on 08/12/2018 for Flank Pain (x 2 weeks ago, dark color urine and loose stool )   HPI Flank Pain Patient has had new flank/Left sided lower abdominal pain.  Patient also reports dark urine, diarrhea (loose stools).  Onset about 2.5-3 weeks ago.  Feels left side pain, internally.  Also has upper chest pain, but that has been present "for years".  Pain is not keeping him from working/doing job.  Pain is present 24 hours per day without improvement.  Is drinking water only without improvement.   - Is hot-natured, sweats normally. No chills. - Loose BM onset was around same time as flank pain.  Started with Dexilant, but has worsened over last 2 weeks.  Very loose with fluffy stools [place] 6 BM 4-5 x per day.   Social History   Tobacco Use  . Smoking status: Former Games developer  . Smokeless tobacco: Former Engineer, water Use Topics  . Alcohol use: Yes    Alcohol/week: 6.0 standard drinks    Types: 6 Cans of beer per week  . Drug use: No    Review of Systems Per HPI unless specifically indicated above     Objective:    BP 113/64 (BP Location: Left Arm, Patient Position: Sitting, Cuff Size: Normal)   Pulse 70   Temp 98.3 F (36.8 C) (Oral)   Ht 5\' 10"  (1.778 m)   Wt 195 lb 6.4 oz (88.6 kg)   BMI 28.04 kg/m   Wt Readings from Last 3 Encounters:  08/12/18 195 lb 6.4 oz (88.6 kg)  02/16/18 196 lb 8 oz (89.1 kg)  09/20/17 188 lb (85.3 kg)    Physical Exam  Constitutional: He is oriented to person, place, and time. He appears well-developed and well-nourished. No distress.  HENT:  Head: Normocephalic and atraumatic.  Cardiovascular: Normal rate, regular rhythm, S1 normal, S2 normal, normal heart sounds and intact distal pulses.  Pulmonary/Chest: Effort normal and breath sounds normal. No respiratory distress.  Abdominal: Soft. He exhibits no  distension. Bowel sounds are increased. There is no hepatosplenomegaly. There is generalized tenderness (generalized w sharp LLQ tenderness, localized to lateral side of abdomen above pelvic bone) and tenderness in the left lower quadrant. No hernia.  Neurological: He is alert and oriented to person, place, and time.  Skin: Skin is warm and dry.  Psychiatric: He has a normal mood and affect. His behavior is normal.  Vitals reviewed.    Results for orders placed or performed in visit on 01/19/18  CBC with Differential/Platelet  Result Value Ref Range   WBC 4.7 3.4 - 10.8 x10E3/uL   RBC 4.95 4.14 - 5.80 x10E6/uL   Hemoglobin 16.1 13.0 - 17.7 g/dL   Hematocrit 40.9 81.1 - 51.0 %   MCV 93 79 - 97 fL   MCH 32.5 26.6 - 33.0 pg   MCHC 34.8 31.5 - 35.7 g/dL   RDW 91.4 78.2 - 95.6 %   Platelets 225 150 - 379 x10E3/uL   Neutrophils 61 Not Estab. %   Lymphs 27 Not Estab. %   Monocytes 9 Not Estab. %   Eos 3 Not Estab. %   Basos 0 Not Estab. %   Neutrophils Absolute 2.8 1.4 - 7.0 x10E3/uL   Lymphocytes Absolute 1.3 0.7 - 3.1 x10E3/uL   Monocytes Absolute 0.4 0.1 -  0.9 x10E3/uL   EOS (ABSOLUTE) 0.1 0.0 - 0.4 x10E3/uL   Basophils Absolute 0.0 0.0 - 0.2 x10E3/uL   Immature Granulocytes 0 Not Estab. %   Immature Grans (Abs) 0.0 0.0 - 0.1 x10E3/uL  Hemoglobin A1c  Result Value Ref Range   Hgb A1c MFr Bld 5.5 4.8 - 5.6 %   Est. average glucose Bld gHb Est-mCnc 111 mg/dL  Comprehensive metabolic panel  Result Value Ref Range   Glucose 83 65 - 99 mg/dL   BUN 16 6 - 24 mg/dL   Creatinine, Ser 4.78 0.76 - 1.27 mg/dL   GFR calc non Af Amer 88 >59 mL/min/1.73   GFR calc Af Amer 101 >59 mL/min/1.73   BUN/Creatinine Ratio 16 9 - 20   Sodium 143 134 - 144 mmol/L   Potassium 4.4 3.5 - 5.2 mmol/L   Chloride 104 96 - 106 mmol/L   CO2 25 20 - 29 mmol/L   Calcium 9.6 8.7 - 10.2 mg/dL   Total Protein 6.9 6.0 - 8.5 g/dL   Albumin 4.6 3.5 - 5.5 g/dL   Globulin, Total 2.3 1.5 - 4.5 g/dL    Albumin/Globulin Ratio 2.0 1.2 - 2.2   Bilirubin Total 0.5 0.0 - 1.2 mg/dL   Alkaline Phosphatase 79 39 - 117 IU/L   AST 22 0 - 40 IU/L   ALT 32 0 - 44 IU/L  Lipid Panel With LDL/HDL Ratio  Result Value Ref Range   Cholesterol, Total 271 (H) 100 - 199 mg/dL   Triglycerides 295 (H) 0 - 149 mg/dL   HDL 34 (L) >62 mg/dL   VLDL Cholesterol Cal 56 (H) 5 - 40 mg/dL   LDL Calculated 130 (H) 0 - 99 mg/dL   LDl/HDL Ratio 5.3 (H) 0.0 - 3.6 ratio      Assessment & Plan:   Problem List Items Addressed This Visit    None    Visit Diagnoses    Flank pain    -  Primary   Relevant Orders   POCT Urinalysis Dipstick (Completed)   CBC with Differential/Platelet   Comprehensive metabolic panel (Completed)   Left lower quadrant abdominal pain       Relevant Orders   CBC with Differential/Platelet   Comprehensive metabolic panel (Completed)      Subacute worsening abdominal cramping without nausea/vomiting.  Endorses diarrhea without bright red blood per rectum.  Likely could be diverticulitis based on exam findings of focal tenderness.  Cannot exclude gastrotenteritis vs IBS. - Hemodynamically stable, afebrile, currently hydrated, no acute abdomen today, but is ill and uncomfortable  Plan: 1. Check CMET, CBC 2. Start oral antibiotics with Flagyl 500 TID and Cipro 500 BID for 10 days 3. Considered start Dicyclomine (Bentyl) for abdominal cramping/spasms QID #40 - defer 4. For diarrhea, can consider starting metamucil after abdominal pain resolves.  Patient verbalizes understanding. 5. Strict return criteria or when to go to ED if worsening 24-48 hours 6. Future may follow-up again with GI given duration of symptoms 7. Follow-up in 7-10 days prn.   Meds ordered this encounter  Medications  . ciprofloxacin (CIPRO) 500 MG tablet    Sig: Take 1 tablet (500 mg total) by mouth 2 (two) times daily for 5 days.    Dispense:  20 tablet    Refill:  0    Order Specific Question:   Supervising  Provider    Answer:   Smitty Cords [[id number]]  . metroNIDAZOLE (FLAGYL) 500 MG tablet    Sig: Take  1 tablet (500 mg total) by mouth 3 (three) times daily for 7 days.    Dispense:  21 tablet    Refill:  0    Order Specific Question:   Supervising Provider    Answer:   Smitty Cords [[id number]]    Follow up plan: Return 7-10 days if symptoms worsen or fail to improve.  Wilhelmina Mcardle, DNP, AGPCNP-BC Adult Gerontology Primary Care Nurse Practitioner [facility] 08/12/2018, 1:33 PM

## 2018-08-13 LAB — COMPREHENSIVE METABOLIC PANEL
ALT: 41 IU/L (ref 0–44)
AST: 21 IU/L (ref 0–40)
Albumin/Globulin Ratio: 1.9 (ref 1.2–2.2)
Albumin: 4.6 g/dL (ref 3.5–5.5)
Alkaline Phosphatase: 88 IU/L (ref 39–117)
BUN/Creatinine Ratio: 13 (ref 9–20)
BUN: 14 mg/dL (ref 6–24)
Bilirubin Total: 0.5 mg/dL (ref 0.0–1.2)
CO2: 23 mmol/L (ref 20–29)
Calcium: 9.3 mg/dL (ref 8.7–10.2)
Chloride: 106 mmol/L (ref 96–106)
Creatinine, Ser: 1.08 mg/dL (ref 0.76–1.27)
GFR calc Af Amer: 88 mL/min/{1.73_m2} (ref >59)
GFR calc non Af Amer: 76 mL/min/{1.73_m2} (ref >59)
Globulin, Total: 2.4 g/dL (ref 1.5–4.5)
Glucose: 97 mg/dL (ref 65–99)
Potassium: 4.3 mmol/L (ref 3.5–5.2)
Sodium: 146 mmol/L — ABNORMAL HIGH (ref 134–144)
Total Protein: 7 g/dL (ref 6.0–8.5)

## 2018-08-13 LAB — CBC WITH DIFFERENTIAL/PLATELET
Basophils Absolute: 0 10*3/uL (ref 0.0–0.2)
Basos: 0 %
EOS (ABSOLUTE): 0.1 10*3/uL (ref 0.0–0.4)
Eos: 1 %
Hematocrit: 44.1 % (ref 37.5–51.0)
Hemoglobin: 15.2 g/dL (ref 13.0–17.7)
Immature Grans (Abs): 0 10*3/uL (ref 0.0–0.1)
Immature Granulocytes: 0 %
Lymphocytes Absolute: 1.3 10*3/uL (ref 0.7–3.1)
Lymphs: 23 %
MCH: 30.7 pg (ref 26.6–33.0)
MCHC: 34.5 g/dL (ref 31.5–35.7)
MCV: 89 fL (ref 79–97)
Monocytes Absolute: 0.7 10*3/uL (ref 0.1–0.9)
Monocytes: 11 %
Neutrophils Absolute: 3.6 10*3/uL (ref 1.4–7.0)
Neutrophils: 65 %
Platelets: 234 10*3/uL (ref 150–450)
RBC: 4.95 x10E6/uL (ref 4.14–5.80)
RDW: 13.3 % (ref 12.3–15.4)
WBC: 5.7 10*3/uL (ref 3.4–10.8)

## 2018-08-25 ENCOUNTER — Telehealth: Payer: Self-pay | Admitting: Nurse Practitioner

## 2018-08-25 DIAGNOSIS — R1032 Left lower quadrant pain: Secondary | ICD-10-CM | POA: Diagnosis not present

## 2018-08-25 DIAGNOSIS — K591 Functional diarrhea: Secondary | ICD-10-CM | POA: Diagnosis not present

## 2018-08-25 DIAGNOSIS — E785 Hyperlipidemia, unspecified: Secondary | ICD-10-CM

## 2018-08-25 MED ORDER — DICYCLOMINE HCL 10 MG PO CAPS: 10.0000 mg | ORAL_CAPSULE | Freq: Three times a day (TID) | ORAL | 0 refills | Status: DC

## 2018-08-25 NOTE — Telephone Encounter (Signed)
Pt still has diarrhea and left side pain.  Please call [phone]

## 2018-08-25 NOTE — Telephone Encounter (Signed)
Patient called back after finishing course of antibiotics he still has same pain and diarrhea not improved. Please suggest.

## 2018-08-25 NOTE — Telephone Encounter (Signed)
Patient presented to clinic 08/12/2018 for LLQ abdominal pain.  Treated for presumed diverticulitis without improvement. - Continues to have pain LLQ abdomen near "where appendix scar would be located on R side" consistent with exam 08/12/2018.  Patient has already had appendectomy.  Labs negative when checked on 08/13/2018.  Abdominal exam largely benign except LLQ abdominal pain on palpation.   Work to complete workup for likely gastroenteritis cause as diverticulitis would likely be resolved after antibiotics. - Followup with stool culture, ova, parasites (no c.diff).  These are ordered. **Patient plans to come around 4:15 Friday to provide the sample in clinic. - May take dicyclomine for symptom relief from diarrhea and lower left.  Rx sent to pharmacy. - Patient verbalizes understanding and knows when to seek emergency care.   Possible next steps if persists or does not improve: - May also consider nephrolithiasis, IBS, diverticulum, abscess with abdominal imaging.  Can consider soon if symptoms worsen.

## 2018-08-26 ENCOUNTER — Telehealth: Payer: Self-pay

## 2018-08-26 NOTE — Telephone Encounter (Signed)
Patient came by office, and unsure if he was able to leave a stool sample, he was given collection container. He may return on Monday for sample. He has Dicyclomine and was advised to take this. Also of note - he reported getting more Cipro from pharmacy than prescribed, however we checked w/ pharmacy they reported only giving appropriate amount. He was advised to complete as prescribed and stop if he had additional days beyond 5 day course as he was not feeling relief.  He was advised to contact and follow-up with GI Dr Servando Snare since not improving.  Also advised to seek more immediate attention over weekend if significant change in symptoms or worse, may go to urgent care or hospital ED  Saralyn Pilar, DO [facility] 08/26/2018, 5:29 PM

## 2018-08-26 NOTE — Telephone Encounter (Signed)
Patient was concerned about taking antibiotics for more than he should also not improving his Sxs and stating that pharmacy gave him 30  tablets of ciprofloxacin instead of 20 and he is still on it instead of 5 days. He started taking Rx on 08/19/18. Advised patient to stop antibiotics and start dicyclomine which was send yesterday and drop stool specimen and future appointment with Dr. Servando Snare. Patient verbalizes understanding and knows when to seek emergency care. Dr. Kirtland Bouchard is aware.

## 2018-08-26 NOTE — Telephone Encounter (Signed)
Patient will stop by office around 2:00 pm for picking up specimen and drop off by 4:00 pm.

## 2018-08-29 ENCOUNTER — Telehealth: Payer: Self-pay | Admitting: Family Medicine

## 2018-08-29 NOTE — Telephone Encounter (Signed)
Advised patient to take as PRN.

## 2018-08-29 NOTE — Telephone Encounter (Signed)
Pt asked how long he needed to take dicyclomine [phone]

## 2018-08-31 LAB — STOOL CULTURE
MICRO NUMBER:: 91256157
MICRO NUMBER:: 91256158
MICRO NUMBER:: 91256160
SHIGA RESULT:: NOT DETECTED
SPECIMEN QUALITY:: ADEQUATE
SPECIMEN QUALITY:: ADEQUATE
SPECIMEN QUALITY:: ADEQUATE

## 2018-08-31 LAB — OVA AND PARASITE EXAMINATION
CONCENTRATE RESULT:: NONE SEEN
MICRO NUMBER:: 91256159
SPECIMEN QUALITY:: ADEQUATE
TRICHROME RESULT:: NONE SEEN

## 2018-08-31 LAB — TIQ-NTM

## 2018-09-07 NOTE — Telephone Encounter (Signed)
error 

## 2018-09-19 ENCOUNTER — Other Ambulatory Visit: Payer: Self-pay | Admitting: Nurse Practitioner

## 2018-09-19 DIAGNOSIS — R1032 Left lower quadrant pain: Secondary | ICD-10-CM

## 2018-09-19 DIAGNOSIS — K591 Functional diarrhea: Secondary | ICD-10-CM

## 2018-09-26 ENCOUNTER — Encounter (INDEPENDENT_AMBULATORY_CARE_PROVIDER_SITE_OTHER): Payer: Self-pay

## 2018-09-26 ENCOUNTER — Encounter: Payer: Self-pay | Admitting: Gastroenterology

## 2018-09-26 ENCOUNTER — Encounter

## 2018-09-26 ENCOUNTER — Other Ambulatory Visit: Payer: Self-pay

## 2018-09-26 ENCOUNTER — Ambulatory Visit: Payer: 59 | Admitting: Gastroenterology

## 2018-09-26 DIAGNOSIS — R1032 Left lower quadrant pain: Secondary | ICD-10-CM | POA: Diagnosis not present

## 2018-09-26 DIAGNOSIS — K602 Anal fissure, unspecified: Secondary | ICD-10-CM

## 2018-09-26 NOTE — Patient Instructions (Addendum)
You are scheduled for a CT abdomen/pelvis w/wo contrast [facility] on Wednesday, Nov 20th at 8:00am. Please arrive at the medical mall registration desk at 7:45am. You cannot have anything to eat or drink after midnight on Tuesday night.  **You will need to pick up contrast media today.   If you need to reschedule this appointment for any reason, please contact central scheduling at [phone].

## 2018-09-26 NOTE — Progress Notes (Signed)
Primary Care Physician: Smitty CordsKaramalegos, Alexander J, DO  Primary Gastroenterologist:  Dr. Midge Miniumarren Novella Shepard  Chief Complaint  Patient presents with  . Rectal Bleeding    HPI: Charles Shepard is a 57 y.o. male here for follow-up.  The patient had seen me back in April for rectal bleeding and was diagnosed with a anal fissure.  The patient was put on a high-fiber diet with being told to use nonalcoholic baby wipes.  At that time the patient was having bleeding.  The patient recently came to his primary care provider with left lower quadrant pain and was started on antibiotics.  The patient was also started on dicyclomine.  The patient had stool sample sent off that were negative.  The patient was recommended to follow up with me today.  The patient reports that the dicyclomine and the antibiotics did not help any of his pain.  He feels that the pain is deep within his abdomen and not superficial.  It does not stop him from working and reports that it does not wake him up from sleep.  He notices it more when he is laying down at night and not when he is working.  Current Outpatient Medications  Medication Sig Dispense Refill  . amLODipine (NORVASC) 5 MG tablet Take 5 mg by mouth daily.  11  . ASPIRIN LOW DOSE 81 MG EC tablet Take 81 mg by mouth daily.  1  . DEXILANT 60 MG capsule TAKE 1 CAPSULE (60 MG TOTAL) BY MOUTH DAILY. 90 capsule 1  . dicyclomine (BENTYL) 10 MG capsule TAKE 1 CAPSULE (10 MG TOTAL) BY MOUTH 4 (FOUR) TIMES DAILY - BEFORE MEALS AND AT BEDTIME. 120 capsule 0  . gabapentin (NEURONTIN) 300 MG capsule TAKE 1 CAPSULE BY MOUTH EVERY DAY AT NIGHT  11  . isosorbide mononitrate (IMDUR) 30 MG 24 hr tablet Take 30 mg by mouth daily.    . rosuvastatin (CRESTOR) 20 MG tablet Take 1 tablet (20 mg total) by mouth daily. (Patient not taking: Reported on 09/26/2018) 90 tablet 3  . triamcinolone (KENALOG) 0.025 % cream Apply 1 application topically 2 (two) times daily. To affected area for 2 weeks  then stop. (Patient not taking: Reported on 08/12/2018) 30 g 1   No current facility-administered medications for this visit.     Allergies as of 09/26/2018  . (No Known Allergies)    ROS:  General: Negative for anorexia, weight loss, fever, chills, fatigue, weakness. ENT: Negative for hoarseness, difficulty swallowing , nasal congestion. CV: Negative for chest pain, angina, palpitations, dyspnea on exertion, peripheral edema.  Respiratory: Negative for dyspnea at rest, dyspnea on exertion, cough, sputum, wheezing.  GI: See history of present illness. GU:  Negative for dysuria, hematuria, urinary incontinence, urinary frequency, nocturnal urination.  Endo: Negative for unusual weight change.    Physical Examination:   BP 132/69   Pulse 76   Ht 5\' 10"  (1.778 m)   Wt 196 lb 8 oz (89.1 kg)   BMI 28.19 kg/m   General: Well-nourished, well-developed in no acute distress.  Eyes: No icterus. Conjunctivae pink. Mouth: Oropharyngeal mucosa moist and pink , no lesions erythema or exudate. Lungs: Clear to auscultation bilaterally. Non-labored. Heart: Regular rate and rhythm, no murmurs rubs or gallops.  Abdomen: Bowel sounds are normal, nontender, nondistended, no hepatosplenomegaly or masses, no abdominal bruits or hernia , no rebound or guarding.  Abdominal tenderness on the left middle quadrant to deep palpation Rectal : Anal fissure noted at 12:00  which was very tender extremities: No lower extremity edema. No clubbing or deformities. Neuro: Alert and oriented x 3.  Grossly intact. Skin: Warm and dry, no jaundice.   Psych: Alert and cooperative, normal mood and affect.  Labs:    Imaging Studies: No results found.  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  The patient has had the symptoms since April.  The patient will be sent to surgery for evaluation for rectal myotomy.  The patient's left-sided abdominal pain  is going to be evaluated with a CT scan of the abdomen pelvis.  It is not clearly musculoskeletal and the etiology of the abdominal pain is unclear at this time.  It is unlikely to be diverticulitis or irritable bowel syndrome since his symptoms did not get better on antibiotics nor do they change at all on dicyclomine.  The patient has been explained the plan and agrees with it.    Charles Minium, MD. [name]   Note: This dictation was prepared with Dragon dictation along with smaller phrase technology. Any transcriptional errors that result from this process are unintentional.

## 2018-09-28 ENCOUNTER — Other Ambulatory Visit: Payer: Self-pay

## 2018-09-28 ENCOUNTER — Ambulatory Visit
Admission: RE | Admit: 2018-09-28 | Discharge: 2018-09-28 | Disposition: A | Discharge status: Home / Self Care | Payer: 59 | Source: Ambulatory Visit | Attending: Gastroenterology | Admitting: Gastroenterology

## 2018-09-28 ENCOUNTER — Encounter: Payer: Self-pay | Admitting: Surgery

## 2018-09-28 ENCOUNTER — Ambulatory Visit: Admission: RE | Admit: 2018-09-28 | Payer: Self-pay | Source: Ambulatory Visit

## 2018-09-28 ENCOUNTER — Ambulatory Visit: Payer: 59 | Admitting: Surgery

## 2018-09-28 ENCOUNTER — Telehealth: Payer: Self-pay | Admitting: Gastroenterology

## 2018-09-28 VITALS — BP 114/72 | HR 88 | Temp 97.7°F | Resp 16 | Ht 70.0 in | Wt 195.0 lb

## 2018-09-28 DIAGNOSIS — R197 Diarrhea, unspecified: Secondary | ICD-10-CM | POA: Diagnosis not present

## 2018-09-28 DIAGNOSIS — R1032 Left lower quadrant pain: Secondary | ICD-10-CM | POA: Diagnosis present

## 2018-09-28 DIAGNOSIS — K602 Anal fissure, unspecified: Secondary | ICD-10-CM | POA: Diagnosis not present

## 2018-09-28 DIAGNOSIS — Z9049 Acquired absence of other specified parts of digestive tract: Secondary | ICD-10-CM | POA: Diagnosis not present

## 2018-09-28 MED ORDER — IOHEXOL 300 MG/ML  SOLN
100.0000 mL | Freq: Once | INTRAMUSCULAR | Status: AC | PRN
  Administered 2018-09-28: 100 mL via INTRAVENOUS

## 2018-09-28 NOTE — Telephone Encounter (Signed)
Patient also left vm yesterday 11.19.19 @4 :39PM calling about this. Patient requesting a call back to discuss.

## 2018-09-28 NOTE — Patient Instructions (Addendum)
Warm Sitz bath 15 minutes twice a day Anal fissure medication two times a day [facility] [address] [place] [id number] Botox injection would be another option   Anal Fissure, Adult An anal fissure is a small tear or crack in the skin around the opening of the butt (anus).Bleeding from the tear or crack usually stops on its own within a few minutes. The bleeding may happen every time you poop (have a bowel movement) until the tear or crack heals. Follow these instructions at home: Eating and drinking  Avoid bananas and dairy products. These foods can make it hard to poop.  Drink enough fluid to keep your pee (urine) clear or pale yellow.  Eat a lot of fruit, whole grains, and vegetables. General instructions  Keep the butt area as clean and dry as you can.  Take a warm water bath (sitz bath) as told by your doctor. Do not use soap.  Take over-the-counter and prescription medicines only as told by your doctor.  Use creams or ointments only as told by your doctor.  Keep all follow-up visits as told by your doctor. This is important. Contact a doctor if:  You have more bleeding.  You have a fever.  You have watery poop (diarrhea) that is mixed with blood.  You have pain.  You problem gets worse, not better. This information is not intended to replace advice given to you by your health care provider. Make sure you discuss any questions you have with your health care provider. Document Released: 06/24/2011 Document Revised: 04/02/2016 Document Reviewed: 01/21/2015 Elsevier Interactive Patient Education  2018 [employer]   How to Take a [employer]itz Bath A sitz bath is a warm water bath that is taken while you are sitting down. The water should only come up to your hips and should cover your buttocks. Your health care provider may recommend a sitz bath to help you:  Clean the lower part of your body, including your genital area.  With itching.  With pain.  With  sore muscles or muscles that tighten or spasm.  How to take a sitz bath Take 3-4 sitz baths per day or as told by your health care provider. 1. Partially fill a bathtub with warm water. You will only need the water to be deep enough to cover your hips and buttocks when you are sitting in it. 2. If your health care provider told you to put medicine in the water, follow the directions exactly. 3. Sit in the water and open the tub drain a little. 4. Turn on the warm water again to keep the tub at the correct level. Keep the water running constantly. 5. Soak in the water for 15-20 minutes or as told by your health care provider. 6. After the sitz bath, pat the affected area dry first. Do not rub it. 7. Be careful when you stand up after the sitz bath because you may feel dizzy.  Contact a health care provider if:  Your symptoms get worse. Do not continue with sitz baths if your symptoms get worse.  You have new symptoms. Do not continue with sitz baths until you talk with your health care provider. This information is not intended to replace advice given to you by your health care provider. Make sure you discuss any questions you have with your health care provider. Document Released: 07/18/2004 Document Revised: 03/25/2016 Document Reviewed: 10/24/2014 Elsevier Interactive Patient Education  [employer]2018 [employer] Inc.

## 2018-09-28 NOTE — Telephone Encounter (Signed)
Pre Cert center from [facility] calling stating there was no pre cert done for patients CT today 11.20.19. Patient did not show up but due to no pre cert but they are requesting to get one if he reschedules.

## 2018-09-28 NOTE — Telephone Encounter (Signed)
Pre-service cancelled prior to getting authorization. Received authorization this morning. Pt has been rescheduled for CT scan.

## 2018-09-30 ENCOUNTER — Telehealth: Payer: Self-pay

## 2018-09-30 NOTE — Telephone Encounter (Signed)
Pt notified of results

## 2018-09-30 NOTE — Telephone Encounter (Signed)
-----   Message from Midge Miniumarren Wohl, MD sent at 09/29/2018  8:21 AM EST ----- Let the patient know that CT scan did not show any cause for abdominal pain or changes in his bowel habits

## 2018-09-30 NOTE — Progress Notes (Signed)
Patient ID: Charles Shepard, male   DOB: 02/16/1961, 57 y.o.   MRN: [id number]  HPI Charles Shepard is a 57 y.o. male patient request of Dr.Wohl for anal fissure. I personally discussed the case with him in detail. Reports significant anorectal pain for the last few months.  Intermittent sharp pain after having a bowel movement.  He feels that he is passing razor blades.  Other than having bowel movements there is no other specific aggravating factors.  He also had some left-sided abdominal pain and had a CT scan that I have personally reviewed out any major significant findings.  He also reports some intermittent diarrhea.  No fevers no chills and no weight loss. He does have some intermittent hematochezia after having a BM.  HPI  Past Medical History:  Diagnosis Date  . Dyslipidemia   . GERD (gastroesophageal reflux disease)   . Hypertension     Past Surgical History:  Procedure Laterality Date  . APPENDECTOMY    . broken leg  1999   Left, has a rod and a screw from ankle to knee  . CERVICAL DISCECTOMY  2008   c-5  . COLONOSCOPY  2014   Dr Servando SnareWohl    Family History  Problem Relation Age of Onset  . Hyperlipidemia Mother   . Diabetes Paternal Aunt   . Colon cancer Neg Hx     Social History Social History   Tobacco Use  . Smoking status: Former Smoker    Packs/day: 0.50    Years: 10.00    Pack years: 5.00    Types: Cigarettes    Last attempt to quit: 11/10/2007    Years since quitting: 10.8  . Smokeless tobacco: Never Used  Substance Use Topics  . Alcohol use: Yes    Alcohol/week: 6.0 standard drinks    Types: 6 Cans of beer per week  . Drug use: No    No Known Allergies  Current Outpatient Medications  Medication Sig Dispense Refill  . amLODipine (NORVASC) 5 MG tablet Take 5 mg by mouth daily.  11  . ASPIRIN LOW DOSE 81 MG EC tablet Take 81 mg by mouth daily.  1  . DEXILANT 60 MG capsule TAKE 1 CAPSULE (60 MG TOTAL) BY MOUTH DAILY. 90 capsule 1  . gabapentin  (NEURONTIN) 300 MG capsule TAKE 1 CAPSULE BY MOUTH EVERY DAY AT NIGHT  11  . rosuvastatin (CRESTOR) 20 MG tablet Take 1 tablet (20 mg total) by mouth daily. 90 tablet 3   No current facility-administered medications for this visit.      Review of Systems Full ROS  was asked and was negative except for the information on the HPI  Physical Exam Blood pressure 114/72, pulse 88, temperature 97.7 F (36.5 C), temperature source Skin, resp. rate 16, height 5\' 10"  (1.778 m), weight 195 lb (88.5 kg), SpO2 95 %. CONSTITUTIONAL: NAD EYES: Pupils are equal, round, and reactive to light, Sclera are non-icteric. EARS, NOSE, MOUTH AND THROAT: The oropharynx is clear. The oral mucosa is pink and moist. Hearing is intact to voice. LYMPH NODES:  Lymph nodes in the neck are normal. RESPIRATORY:  Lungs are clear. There is normal respiratory effort, with equal breath sounds bilaterally, and without pathologic use of accessory muscles. CARDIOVASCULAR: Heart is regular without murmurs, gallops, or rubs. GI: The abdomen is soft, nontender, and nondistended. There are no palpable masses. There is no hepatosplenomegaly. There are normal bowel sounds in all quadrants. GU: Rectal with posteriro midline fissure,  no  Masses. Limited exam due to pain. MUSCULOSKELETAL: Normal muscle strength and tone. No cyanosis or edema.   SKIN: Turgor is good and there are no pathologic skin lesions or ulcers. NEUROLOGIC: Motor and sensation is grossly normal. Cranial nerves are grossly intact. PSYCH:  Oriented to person, place and time. Affect is normal.  Data Reviewed  I have personally reviewed the patient's imaging, laboratory findings and medical records.    Assessment/Plan 66-year-old male with anal fissure.  Discussed with the patient detail about the natural history of his disease.  Recommend twice daily sitz bath's, nifedipine cream on high-fiber diet.  I also discussed with him the role for buttocks injection chemical  sphincterotomy versus surgical lateral internal sphincterotomy. The he wishes to entertain the chemical sphincterotomy but apparently he is going on a work trip and wishes to postpone this for about a month or 2.  He will call us back when he is ready for chemical sphincterotomy A copy of  this report was sent to the referring provider Time spent with the patient was 60 minutes, with more than 50% of the time spent in face-to-face education, counseling and care coordination.     Sterling Big, MD FACS General Surgeon 09/30/2018, 11:19 AM

## 2018-10-01 ENCOUNTER — Encounter: Payer: Self-pay | Admitting: Nurse Practitioner

## 2018-10-14 ENCOUNTER — Other Ambulatory Visit: Payer: Self-pay

## 2018-10-24 ENCOUNTER — Other Ambulatory Visit: Payer: Self-pay

## 2018-10-24 DIAGNOSIS — K219 Gastro-esophageal reflux disease without esophagitis: Secondary | ICD-10-CM

## 2018-10-24 DIAGNOSIS — K449 Diaphragmatic hernia without obstruction or gangrene: Principal | ICD-10-CM

## 2018-10-24 MED ORDER — DEXLANSOPRAZOLE 60 MG PO CPDR: 1.0000 | DELAYED_RELEASE_CAPSULE | Freq: Every day | ORAL | 1 refills | Status: DC

## 2018-10-31 ENCOUNTER — Ambulatory Visit: Payer: 59 | Admitting: Surgery

## 2019-01-02 ENCOUNTER — Encounter: Payer: Self-pay | Admitting: Family Medicine

## 2019-01-02 ENCOUNTER — Ambulatory Visit (INDEPENDENT_AMBULATORY_CARE_PROVIDER_SITE_OTHER): Payer: 59 | Admitting: Family Medicine

## 2019-01-02 ENCOUNTER — Other Ambulatory Visit: Payer: Self-pay

## 2019-01-02 VITALS — BP 134/76 | HR 59 | Resp 16 | Ht 70.0 in | Wt 195.0 lb

## 2019-01-02 DIAGNOSIS — M7711 Lateral epicondylitis, right elbow: Secondary | ICD-10-CM | POA: Diagnosis not present

## 2019-01-02 MED ORDER — PREDNISONE 20 MG PO TABS: ORAL_TABLET | ORAL | 0 refills | Status: DC

## 2019-01-02 NOTE — Patient Instructions (Addendum)
Thank you for coming to the office today.  If not improving we can refer back to Dr Sherryll Burger for Neurology in future for nerve testing.  -------------------------------  You most likely have "Tennis Elbow" or "Lateral Epicondylitis" - This is inflammation or tendonitis affecting the muscles of your forearm - Usually it is caused by frequent or daily repetitive activities using your arms, lifting, rotating, pulling, twisting - it can happen over days to months or longer from repetitive strain  One of the most important parts of treatment is rest and avoiding the activities that make it worse.  Recommend trial of steroid prednisone taper over 7 days, Take daily with food. Start with 60mg  (3 pills) x 2 days, then reduce to 40mg  (2 pills) x 2 days, then 20mg  (1 pill) x 3 days  While on prednisone - DO NOT TAKE any ibuprofen, aleve, motrin while you are taking this medicine  AFTER finish prednisone - can try LOW dose Anti-inflammatory with Naproxen or Aleve OTC 220 or 250mg  tabs - take one with food and plenty of water TWICE daily every day (breakfast and dinner), for next 1 to 2 weeks, then you may take only as needed - It is safe to take Tylenol Ext Str 500mg  tabs - take 1 to 2 (max dose 1000mg ) every 6 hours as needed for breakthrough pain, max 24 hour daily dose is 6 to 8 tablets or 4000mg   Use RICE therapy: - R - Rest / relative rest with activity modification avoid overuse of joint - I - Ice packs (make sure you use a towel or sock / something to protect skin) - C - Compression with ACE wrap to apply pressure and reduce swelling allowing more support  Try a Forearm Tennis Elbow Strap over muscle as demonstrated - use with repetitive activity to reduce strain of muscle    - E - Elevation - if significant swelling, lift leg above heart level (toes above your nose) to help reduce swelling, most helpful at night after day of being on your feet  May consider Physical Therapy referral if  interested   Please schedule a Follow-up Appointment to: Return in about 6 weeks (around 02/13/2019), or if symptoms worsen or fail to improve, for Right elbow pain.  If you have any other questions or concerns, please feel free to call the office or send a message through MyChart. You may also schedule an earlier appointment if necessary.  Additionally, you may be receiving a survey about your experience at our office within a few days to 1 week by e-mail or mail. We value your feedback.  Saralyn Pilar, DO [facility], [place]

## 2019-01-02 NOTE — Progress Notes (Signed)
Subjective:    Patient ID: Charles Shepard, male    DOB: [birth year], 58 y.o.   MRN: [id number]  Charles Shepard is a 58 y.o. male presenting on 01/02/2019 for Arm Pain (Constant pain inR arm x 2 months radiates to shoulder denies injury )   HPI   Right Upper Extremity Pain - Reports about 2 months onset of gradual pain and weakness in Right upper arm near elbow, outer aspect of inner arm, fairly constant pain in this location, radiates up arm sometimes, not down arm, it seems to be worse in morning when he wakes up. - At work he does a lot of repetitive work with his arms, often lifting and using force with arms, can provoke it worsening pain. - Tried Advil, Aspirin, Tylenol, topical OTC muscle rub PRN - limited relief - Taking Gabapentin 300mg  nightly without obvious relief of R forearm pain, it does help his neck pain, he has history of OA/DJD cervical spine disease and some spinal stenosis among other problems, reportedly had surgery for his neck spine. - Not tried ice or heat - he sleeps on his shoulder or side, and often has problem with flare up pain in shoulder due to positioning, occasionally arm will go numb from this. No documented history of arthritis, prior upper ext injury, imaging  - history of GERD - Admits he felt a "knot" in the muscle - Admits some weakness in R arm with  - Denies any fevers, chills, redness, swelling, numbness tingling, injury trauma  Health Maintenance: Due for Flu Shot, declines today despite counseling on benefits   Depression screen PHQ 2/9 09/20/2017  Decreased Interest 0  Down, Depressed, Hopeless 0  PHQ - 2 Score 0    Social History   Tobacco Use  . Smoking status: Former Smoker    Packs/day: 0.50    Years: 10.00    Pack years: 5.00    Types: Cigarettes    Last attempt to quit: 11/10/2007    Years since quitting: 11.1  . Smokeless tobacco: Never Used  Substance Use Topics  . Alcohol use: Yes    Alcohol/week: 6.0 standard drinks   Types: 6 Cans of beer per week  . Drug use: No    Review of Systems Per HPI unless specifically indicated above     Objective:    BP 134/76   Pulse (!) 59   Resp 16   Ht 5\' 10"  (1.778 m)   Wt 195 lb (88.5 kg)   SpO2 100%   BMI 27.98 kg/m   Wt Readings from Last 3 Encounters:  01/02/19 195 lb (88.5 kg)  09/28/18 195 lb (88.5 kg)  09/26/18 196 lb 8 oz (89.1 kg)    Physical Exam Vitals signs and nursing note reviewed.  Constitutional:      General: He is not in acute distress.    Appearance: He is well-developed. He is not diaphoretic.     Comments: Well-appearing, comfortable, cooperative  HENT:     Head: Normocephalic and atraumatic.  Eyes:     General:        Right eye: No discharge.        Left eye: No discharge.     Conjunctiva/sclera: Conjunctivae normal.  Cardiovascular:     Rate and Rhythm: Normal rate.  Pulmonary:     Effort: Pulmonary effort is normal.  Musculoskeletal:     Comments: Right Forearm / Hand/Wrist Inspection: Normal appearance, symmetrical, no edema or erythema. Palpation: Mild tender over  lateral forearm muscle tendons near antecubital fossa, not tender in muscle belly or over epicondyle or olecranon. ROM: full active elbow and wrist ROM flex / ext, ulnar / radial deviation Special Testing: Tinel's median and ulnar nerve negative. Pain with resistance to biceps flexion and pronation of forearm Strength: 5/5 grip, thumb opposition, forearm flex/ext Neurovascular: distally intact  Right Shoulder Full active ROM flex/extend, internal rotation behind back.  Skin:    General: Skin is warm and dry.     Findings: No erythema or rash.  Neurological:     Mental Status: He is alert and oriented to person, place, and time.  Psychiatric:        Behavior: Behavior normal.     Comments: Well groomed, good eye contact, normal speech and thoughts    I have personally reviewed the radiology report from MR C-spine on 12/10/17.  CLINICAL DATA:  Initial  evaluation for neck pain with pain between shoulders with left hand numbness for 2 months. History of prior surgery.  EXAM: MRI CERVICAL SPINE WITHOUT CONTRAST  TECHNIQUE: Multiplanar, multisequence MR imaging of the cervical spine was performed. No intravenous contrast was administered.  COMPARISON:  Prior CT from 04/04/2016.  FINDINGS: Alignment: Straightening of the normal cervical lordosis. Trace anterolisthesis of C5 on C6.  Vertebrae: Susceptibility artifact from prior ACDF at C6-7. There is solid osseous fusion at this level. Vertebral body heights maintained without acute or chronic fracture. Bone marrow signal intensity normal. No discrete or worrisome osseous lesions. No abnormal marrow edema.  Cord: Signal intensity within the cervical spinal cord is normal.  Posterior Fossa, vertebral arteries, paraspinal tissues: Visualized brain and posterior fossa are normal. Normal craniocervical junction. Paraspinous and prevertebral soft tissues within normal limits. Normal intravascular flow voids present within the vertebral arteries bilaterally.  Disc levels:  C2-C3: Mild bilateral uncovertebral hypertrophy without significant disc bulge. Right-sided facet hypertrophy. No significant canal or foraminal stenosis.  C3-C4: Diffuse disc bulge with bilateral uncovertebral hypertrophy, left greater than right. Broad posterior disc osteophyte flattens and largely effaces the ventral thecal sac. Mild facet ligamentum flavum hypertrophy. Resultant mild to moderate spinal stenosis. Severe left with moderate right C4 foraminal narrowing.  C4-C5: Mild disc bulge. Right greater than left uncovertebral spurring. Mild facet hypertrophy. Flattening of the ventral thecal sac without significant spinal stenosis. Severe right with moderate left C5 foraminal narrowing.  C5-C6: Diffuse disc bulge with mild uncovertebral hypertrophy. No significant spinal stenosis. Moderate  right C6 foraminal narrowing. No significant left foraminal).  C6-C7:  Prior ACDF.  No residual canal or foraminal stenosis.  C7-T1: Minimal annular disc bulge. Right-sided uncovertebral hypertrophy. No significant canal stenosis. Moderate right C8 foraminal stenosis. No significant left foraminal encroachment.  Visualized upper thoracic spine within normal limits.  IMPRESSION: 1. 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 Mild to moderate spinal stenosis at this level as well. 2. Additional multifactorial degenerative changes with resultant multilevel foraminal narrowing as above. Other notable findings include moderate right C4 foraminal stenosis, severe right with moderate left C5 foraminal narrowing, moderate right C6 foraminal stenosis, with moderate right C8 foraminal narrowing. 3. Prior ACDF at C6-7 without residual stenosis.   Electronically Signed   By: Rise Mu M.D.   On: 12/10/2017 21:37     Assessment & Plan:   Problem List Items Addressed This Visit    None    Visit Diagnoses    Lateral  epicondylitis of right elbow    -  Primary   Relevant Medications   predniSONE (DELTASONE) 20 MG tablet      Clinically R forearm tendonitis, more specifically lateral epicondylitis given location and provoking factors on exam - Likely acute on chronic flare due to repetitive daily activity - Possible some underlying OA/DJD as well - with known C-spine disease OA/DJD stenosis, prior MRI of neck 2019 - Unlikely to be acute muscle tear or fracture without injury - Last imaging none of forearm / elbow  Plan - Reviewed precautions with diagnosis and goal to allow it to heal / modify activity - Start Prednisone burst 7 day taper instructions given, hold oral NSAID - Limited NSAID due to GERD history, after pred can try low dose OTC Aleve  220-250mg  BID for 1-2 weeks with meal - Take Tylenol up to 1g TID PRN breakthrough - Recommend RICE therapy, emphasis on Forearm Strap to help support the muscle limit repetitive strain, may use ACE wrap for compression - Offered PT referral - declined today will reconsider if needed - limited based on work travel schedule - He may try home exercises instead for tennis elbow - Continue Gabapentin nightly - Follow-up if not improving sooner 4-6 weeks, reconsider X-ray and PT, and consider return to [facility] Dr Sherryll Burger if needed - he may warrant nerve conduction testing - given his history of C spinal disease.   Meds ordered this encounter  Medications  . predniSONE (DELTASONE) 20 MG tablet    Sig: Take daily with food. Start with 60mg  (3 pills) x 2 days, then reduce to 40mg  (2 pills) x 2 days, then 20mg  (1 pill) x 3 days    Dispense:  13 tablet    Refill:  0    Follow up plan: Return in about 6 weeks (around 02/13/2019), or if symptoms worsen or fail to improve, for Right elbow pain.  Saralyn Pilar, DO [facility] 01/02/2019, 4:34 PM

## 2019-02-10 ENCOUNTER — Other Ambulatory Visit: Payer: Self-pay | Admitting: Family Medicine

## 2019-02-10 DIAGNOSIS — E785 Hyperlipidemia, unspecified: Secondary | ICD-10-CM

## 2019-03-03 ENCOUNTER — Other Ambulatory Visit: Payer: Self-pay | Admitting: Family Medicine

## 2019-03-03 DIAGNOSIS — E785 Hyperlipidemia, unspecified: Secondary | ICD-10-CM

## 2019-03-20 ENCOUNTER — Other Ambulatory Visit: Payer: Self-pay

## 2019-03-20 ENCOUNTER — Ambulatory Visit (INDEPENDENT_AMBULATORY_CARE_PROVIDER_SITE_OTHER): Payer: 59 | Admitting: Family Medicine

## 2019-03-20 ENCOUNTER — Other Ambulatory Visit: Payer: 59

## 2019-03-20 ENCOUNTER — Encounter: Payer: Self-pay | Admitting: Family Medicine

## 2019-03-20 VITALS — BP 137/82 | HR 64 | Temp 98.4°F | Resp 16 | Ht 70.0 in | Wt 185.0 lb

## 2019-03-20 DIAGNOSIS — K602 Anal fissure, unspecified: Secondary | ICD-10-CM

## 2019-03-20 DIAGNOSIS — L290 Pruritus ani: Secondary | ICD-10-CM

## 2019-03-20 DIAGNOSIS — R202 Paresthesia of skin: Secondary | ICD-10-CM | POA: Diagnosis not present

## 2019-03-20 MED ORDER — NIFEDIPINE POWD: 2 refills | Status: DC

## 2019-03-20 NOTE — Progress Notes (Signed)
Subjective:    Patient ID: Charles Shepard, male    DOB: 06/14/1961, 58 y.o.   MRN: [id number]  Charles GladdenDarryl L Elsbernd is a 58 y.o. male presenting on 03/20/2019 for Anal Fissure, Pruritus Ani and Right Upper Extremity Paresthesia  Originally scheduled for an Annual Physical / Wellness - however patient actually had several acute on chronic medical complaints that he requested to be treated today instead. Unable to also do an annual physical today, he was advised to re-schedule when not focusing on multiple medical problems.  HPI  Right Upper Extremity Pain - Last visit with me 01/02/19, for initial visit for same problem w/ dx R lateral epicondylitis based on exam and history, treated with prednisone burst as well as other conservative therapy recommendations, see prior notes for background information. - Interval update with temporary improvement only on prednisone then symptoms returned - Today patient reports still has same issue, describes episodic pain inner aspect of R elbow only, not over bony elbow or other muscles or forearm or wrist or shoulder. Describes episodic symptoms worse first thing in morning when wake up describes paresthesia travel down into forearm and hand with some tingling and "falling asleep" sensation with some pain, worse as well reproduced with doing above head activity with arms, for instancing rope climbing. 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 Has seen Dr Sherryll BurgerShah in past before - Tried Advil, Aspirin, Tylenol, topical OTC muscle rub PRN - limited relief - Admits some weakness in R arm at times episodic - Denies any fevers, chills, redness, swelling, numbness tingling, injury trauma  Anal Fissure /  Chronic Rectal Pain and Itching, history of rectal bleeding Know prior issue, previously discussed with me in 2017 and since then briefly, also has seen multiple specialist, Dr Servando SnareWohl [facility] 2019 and Dr Everlene FarrierPabon 09/2018, has been on variety of treatments and lifestyle interventions. - Now he is interested in seeking a more 'definitive' treatment as prior treatments only temporary relief - Describes episodes of anal itching and then causing sore or fissure at times - Currently he is having normal bowel movements, mostly soft bowel movements without constipation or diarrhea, or dark stoolsl. - Admits small amount of bright red blood spots of blood on toilet paper at times, usually after scratching and possibly reopening injured skin - Prior rx from Dr Everlene FarrierPabon 09/2018, very effective 30 Nifedipine 0.3% / Lidocaine 1.5%, cream BID [facility]  - needs a refill. He was offered procedural intervention as well but has not pursued this.  Additional complaint - Chronic insomnia for >20-40 years, has tried medication OTC, he would like to return to discuss this problem   [facility] 03/20/2019 09/20/2017  Decreased Interest 0 0  Down, Depressed, Hopeless 0 0  PHQ - 2 Score 0 0    Past Medical History:  Diagnosis Date  . Dyslipidemia   . GERD (gastroesophageal reflux disease)   . Hypertension    Past Surgical History:  Procedure Laterality Date  . APPENDECTOMY    . broken leg  1999   Left, has a rod and a screw from ankle to knee  . CERVICAL DISCECTOMY  2008   c-5  . COLONOSCOPY  2014  Dr Servando Snare   Social History   Socioeconomic History  . Marital status: Married    Spouse name: Not on file  . Number of children: Not on file  . Years of education: Not on file  . Highest education level: Not on file  Occupational History  . Occupation: Microbiologist    Comment: [employer] out of state often to Technical brewer  Social Needs  . Financial resource strain: Not on  file  . Food insecurity:    Worry: Not on file    Inability: Not on file  . Transportation needs:    Medical: Not on file    Non-medical: Not on file  Tobacco Use  . Smoking status: Former Smoker    Packs/day: 0.50    Years: 10.00    Pack years: 5.00    Types: Cigarettes    Last attempt to quit: 11/10/2007    Years since quitting: 11.3  . Smokeless tobacco: Former Engineer, water and Sexual Activity  . Alcohol use: Yes    Alcohol/week: 6.0 standard drinks    Types: 6 Cans of beer per week  . Drug use: No  . Sexual activity: Not on file  Lifestyle  . Physical activity:    Days per week: Not on file    Minutes per session: Not on file  . Stress: Not on file  Relationships  . Social connections:    Talks on phone: Not on file    Gets together: Not on file    Attends religious service: Not on file    Active member of club or organization: Not on file    Attends meetings of clubs or organizations: Not on file    Relationship status: Not on file  . Intimate partner violence:    Fear of current or ex partner: Not on file    Emotionally abused: Not on file    Physically abused: Not on file    Forced sexual activity: Not on file  Other Topics Concern  . Not on file  Social History Narrative  . Not on file   Family History  Problem Relation Age of Onset  . Hyperlipidemia Mother   . Diabetes Paternal Aunt   . Colon cancer Neg Hx    Current Outpatient Medications on File Prior to Visit  Medication Sig  . amLODipine (NORVASC) 5 MG tablet Take 5 mg by mouth daily.  . ASPIRIN LOW DOSE 81 MG EC tablet Take 81 mg by mouth daily.  Marland Kitchen dexlansoprazole (DEXILANT) 60 MG capsule Take 1 capsule (60 mg total) by mouth daily.  . rosuvastatin (CRESTOR) 20 MG tablet TAKE 1 TABLET BY MOUTH EVERY DAY   No current facility-administered medications on file prior to visit.     Review of Systems Per HPI unless specifically indicated above     Objective:    BP 137/82   Pulse 64   Temp  98.4 F (36.9 C) (Oral)   Resp 16   Ht  (1.778 m)   Wt 185 lb (83.9 kg)   BMI 26.54 kg/m   Wt Readings from Last 3 Encounters:  03/20/19 185 lb (83.9 kg)  01/02/19 195 lb (88.5 kg)  09/28/18 195 lb (88.5 kg)    Physical Exam Vitals signs and nursing note reviewed.  Constitutional:      General: He is not in acute distress.    Appearance: He is well-developed. He is not diaphoretic.     Comments: Well-appearing, comfortable, cooperative  HENT:     Head: Normocephalic and atraumatic.  Eyes:     General:        Right eye: No discharge.        Left eye: No discharge.     Conjunctiva/sclera: Conjunctivae normal.  Cardiovascular:     Rate and Rhythm: Normal rate.  Pulmonary:     Effort: Pulmonary effort is normal.  Musculoskeletal:     Comments: Right upper extremity  Normal appearance, no edema, erythema, deformity  Wrist and Elbow Palpation: Non tender elbow olecranon, epicondyles, hand / wrist, carpal bones, including MCP, base of thumb. No distinct anatomical snuff box or scaphoid tenderness.  ROM: full active elbow wrist ROM flex / ext, ulnar / radial deviation Special Testing: Negative Tinel's median nerve, ulnar nerve elbow, Negative Phalen. Some mild positive Reverse Phalen at wrist into hand Strength: 5/5 grip, thumb opposition, wrist flex/ext Neurovascular: distally intact  Skin:    General: Skin is warm and dry.     Findings: No erythema or rash.  Neurological:     Mental Status: He is alert and oriented to person, place, and time.  Psychiatric:        Behavior: Behavior normal.     Comments: Well groomed, good eye contact, normal speech and thoughts     Rectal/GU exam deferred today.      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 likely affecting his symptoms - Prior consultation from [facility] Dr Servando Snare and Gen Surg [facility] Dr Everlene Farrier previously  Plan - For healing if flare up - use rx Nifedipine / Lidocaine cream BID - new rx phoned in to [facility], it was rx originally by Dr Everlene Farrier - For prevention if not having flare up - advised today to use barrier cream OTC zinc oxide and topical hydrocortisone 1% to reduce itch for 1-2 week at a time - May reconsider bowel regimen if develop any concerns or constipation, otherwise seems to not be causing problem - Advised if still not satisfactory results - he can return to General Surgery Dr Everlene Farrier to discuss procedural interventions offered to him - advised can risk complications or other related issues, this type of problem usually is not simple to "cure" but these treatments can help reduce flare up     Paresthesia of right upper extremity       Relevant Orders   Ambulatory referral to Neurology  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 Failed NSAID, Prednisone. REQUESTING Nerve conduction study and evaluation clinically with treatment options  for Right extremity  Extensive counseling today on using elbow sleeve or brace, support if active, especially at night can use a towel to wrap and  avoid elbow flexion to avoid pinching or entrapment worsening.          Orders Placed This Encounter  Procedures  . Ambulatory referral to Neurology    Referral Priority:   Routine    Referral Type:   Consultation    Referral Reason:   Specialty Services Required    Requested Specialty:   Neurology    Number of Visits Requested:   1    Meds ordered this encounter  Medications  . NIFEdipine POWD    Sig: Compounded by [facility] - 0.3% Nifedipine / Lidocaine 1.5% cream BID PRN anal fissure    Dispense:  30 g    Refill:  2    Follow up plan: Return in about 4 weeks (around 04/17/2019) for Virtual phone visit - insomnia / f/u other problems.  Saralyn Pilar, DO [facility] 03/20/2019, 8:52 AM

## 2019-03-20 NOTE — Patient Instructions (Addendum)
For anal fissure  PREVENTION -  protection of the perianal skin with the use of a protective ointment containing zinc oxide (diaper rash barrier cream). - For Itching - OTC Hydrocortisone 1% - use as needed for episodes of itching twice a day for few days at a time only.  ------------------------  FLARE UP  -We will phone in a refill on the same medicated cream, for flare up only twice a day for up to 1-2 weeks then as needed  If nothing is helping - then call back Dr [facility] Address: [address] [place], [place] [address] Phone: [phone]  -------------------------------------------------------------------- Referral back to Dr Sherryll Burger for Right upper extremity pinched nerve / paresthesia  Requesting NERVE CONDUCTION STUDY  Jolene Provost, MD  [phone] [facility] [address]  [facility]  [place], [place] [address]  [phone]    Please schedule a Follow-up Appointment to: Return in about 4 weeks (around 04/17/2019) for Virtual phone visit - insomnia / f/u other problems.  If you have any other questions or concerns, please feel free to call the office or send a message through MyChart. You may also schedule an earlier appointment if necessary.  Additionally, you may be receiving a survey about your experience at our office within a few days to 1 week by e-mail or mail. We value your feedback.  Saralyn Pilar, DO [facility] [place], [place]

## 2019-03-30 DIAGNOSIS — E785 Hyperlipidemia, unspecified: Secondary | ICD-10-CM | POA: Insufficient documentation

## 2019-03-30 DIAGNOSIS — R0789 Other chest pain: Secondary | ICD-10-CM | POA: Diagnosis not present

## 2019-03-30 DIAGNOSIS — R5383 Other fatigue: Secondary | ICD-10-CM | POA: Diagnosis not present

## 2019-03-30 DIAGNOSIS — E559 Vitamin D deficiency, unspecified: Secondary | ICD-10-CM | POA: Insufficient documentation

## 2019-03-30 DIAGNOSIS — Z0000 Encounter for general adult medical examination without abnormal findings: Secondary | ICD-10-CM | POA: Diagnosis not present

## 2019-03-31 DIAGNOSIS — E559 Vitamin D deficiency, unspecified: Secondary | ICD-10-CM | POA: Diagnosis not present

## 2019-05-15 ENCOUNTER — Other Ambulatory Visit: Payer: Self-pay | Admitting: Family Medicine

## 2019-05-15 ENCOUNTER — Other Ambulatory Visit: Payer: Self-pay

## 2019-05-15 ENCOUNTER — Ambulatory Visit: Payer: 59 | Admitting: Family Medicine

## 2019-05-15 DIAGNOSIS — Z20822 Contact with and (suspected) exposure to covid-19: Secondary | ICD-10-CM

## 2019-05-15 DIAGNOSIS — Z20828 Contact with and (suspected) exposure to other viral communicable diseases: Secondary | ICD-10-CM

## 2019-05-15 DIAGNOSIS — R109 Unspecified abdominal pain: Secondary | ICD-10-CM

## 2019-05-15 NOTE — Progress Notes (Signed)
Patient called to schedule apt this morning 05/15/19, he was scheduled for phone virtual visit for [place] testing concerns. He did not answer phone when our office called him for his scheduled virtual apt. He no showed the virtual apt.  On chart review, patient appears to have established care with new provider at [facility]. He has already seen new PCP in past 2 months and he was seen by them today as well 05/15/19 for this same concern as listed above.  He was counted as no show. And we will change PCP in system.  No medical treatment provided by our office today.  Nobie Putnam, [facility] 05/15/2019, 11:48 AM

## 2019-05-24 ENCOUNTER — Other Ambulatory Visit: Payer: Self-pay

## 2019-05-24 ENCOUNTER — Ambulatory Visit
Admission: RE | Admit: 2019-05-24 | Discharge: 2019-05-24 | Disposition: A | Discharge status: Home / Self Care | Payer: 59 | Source: Ambulatory Visit | Attending: Family Medicine | Admitting: Family Medicine

## 2019-05-24 DIAGNOSIS — Z20828 Contact with and (suspected) exposure to other viral communicable diseases: Secondary | ICD-10-CM | POA: Insufficient documentation

## 2019-05-24 DIAGNOSIS — Z20822 Contact with and (suspected) exposure to covid-19: Secondary | ICD-10-CM

## 2019-05-24 DIAGNOSIS — R109 Unspecified abdominal pain: Secondary | ICD-10-CM | POA: Insufficient documentation

## 2019-07-03 ENCOUNTER — Other Ambulatory Visit: Payer: Self-pay

## 2019-07-03 ENCOUNTER — Ambulatory Visit: Payer: 59 | Admitting: Urology

## 2019-07-03 ENCOUNTER — Encounter: Payer: Self-pay | Admitting: Urology

## 2019-07-03 VITALS — BP 112/71 | HR 66 | Ht 70.0 in | Wt 190.0 lb

## 2019-07-03 DIAGNOSIS — R350 Frequency of micturition: Secondary | ICD-10-CM | POA: Diagnosis not present

## 2019-07-03 DIAGNOSIS — R3129 Other microscopic hematuria: Secondary | ICD-10-CM

## 2019-07-03 DIAGNOSIS — N401 Enlarged prostate with lower urinary tract symptoms: Secondary | ICD-10-CM | POA: Diagnosis not present

## 2019-07-03 LAB — BLADDER SCAN AMB NON-IMAGING

## 2019-07-03 NOTE — Progress Notes (Signed)
07/03/2019 12:05 PM   Charles Shepard 12/13/1960 [id number]  Referring provider: Wilford CornerWhitaker, Jason Hestle, PA-C [address] [place] [id number]  Chief Complaint  Patient presents with  . Hematuria    "20+ years with this"    HPI: Charles Shepard 58 y.o. male seen in consultation at request of Debbra RidingJason Whitaker for evaluation of microhematuria.  He had urinalyses in May 2020 and July 2020 which showed 4-10 RBCs on microscopy.  He denies gross hematuria.  He denies flank, abdominal, pelvic or scrotal pain.  He does have mild lower urinary tract symptoms of nocturia x1-2 and occasional urinary urgency.  His voiding symptoms are not bothersome.  He has a long history of microhematuria.  He states I saw him over 20 years ago and he underwent upper tract imaging and cystoscopy which were negative.  He was subsequently reevaluated by Dr. Evelene CroonWolff several years later.  He had an abdominal ultrasound performed on 05/24/2019 which showed normal-appearing kidneys without mass or hydronephrosis.  There was a 15 mm simple cyst in the midpole of the left kidney.  He had a CT of the abdomen and pelvis with contrast November 2019 which showed no significant abnormalities.  He is a former smoker quitting 11 years ago and has a <10-pack-year smoking history  PMH: Past Medical History:  Diagnosis Date  . Dyslipidemia   . GERD (gastroesophageal reflux disease)   . Hypertension     Surgical History: Past Surgical History:  Procedure Laterality Date  . APPENDECTOMY    . broken leg  1999   Left, has a rod and a screw from ankle to knee  . CERVICAL DISCECTOMY  2008   c-5  . COLONOSCOPY  2014   Dr Servando SnareWohl    Home Medications:  Allergies as of 07/03/2019   No Known Allergies     Medication List       Accurate as of July 03, 2019 12:05 PM. If you have any questions, ask your nurse or doctor.        amLODipine 5 MG tablet Commonly known as: NORVASC Take 5 mg by mouth daily.   Aspirin  Low Dose 81 MG EC tablet Generic drug: aspirin Take 81 mg by mouth daily.   dexlansoprazole 60 MG capsule Commonly known as: Dexilant Take 1 capsule (60 mg total) by mouth daily.   NIFEdipine Powd Compounded by [facility] - 0.3% Nifedipine / Lidocaine 1.5% cream BID PRN anal fissure   rosuvastatin 20 MG tablet Commonly known as: CRESTOR TAKE 1 TABLET BY MOUTH EVERY DAY       Allergies: No Known Allergies  Family History: Family History  Problem Relation Age of Onset  . Hyperlipidemia Mother   . Diabetes Paternal Aunt   . Colon cancer Neg Hx     Social History:  reports that he quit smoking about 11 years ago. His smoking use included cigarettes. He has a 5.00 pack-year smoking history. He has quit using smokeless tobacco. He reports current alcohol use of about 6.0 standard drinks of alcohol per week. He reports that he does not use drugs.  ROS: UROLOGY Frequent Urination?: No Hard to postpone urination?: Yes Burning/pain with urination?: No Get up at night to urinate?: Yes Leakage of urine?: No Urine stream starts and stops?: No Trouble starting stream?: No Do you have to strain to urinate?: No Blood in urine?: Yes Urinary tract infection?: No Sexually transmitted disease?: No Injury to kidneys or bladder?: No Painful intercourse?: No  Weak stream?: No Erection problems?: No Penile pain?: No  Gastrointestinal Nausea?: No Vomiting?: No Indigestion/heartburn?: Yes Diarrhea?: No Constipation?: No  Constitutional Fever: No Night sweats?: No Weight loss?: No Fatigue?: No  Skin Skin rash/lesions?: No Itching?: Yes  Eyes Blurred vision?: No Double vision?: No  Ears/Nose/Throat Sore throat?: No Sinus problems?: No  Hematologic/Lymphatic Swollen glands?: No Easy bruising?: No  Cardiovascular Leg swelling?: No Chest pain?: No  Respiratory Cough?: No Shortness of breath?: No  Endocrine Excessive thirst?: No  Musculoskeletal Back pain?:  No Joint pain?: No  Neurological Headaches?: No Dizziness?: No  Psychologic Depression?: No Anxiety?: No  Physical Exam: BP 112/71   Pulse 66   Ht 5\' 10"  (1.778 m)   Wt 190 lb (86.2 kg)   BMI 27.26 kg/m   Constitutional:  Alert and oriented, No acute distress. HEENT: [place] AT, moist mucus membranes.  Trachea midline, no masses. Cardiovascular: No clubbing, cyanosis, or edema. Respiratory: Normal respiratory effort, no increased work of breathing. GI: Abdomen is soft, nontender, nondistended, no abdominal masses GU: No CVA tenderness.  Penis without lesions, testes descended bilaterally without masses or tenderness.  Spermatic cord/epididymis palpably normal bilaterally.  Prostate 40 g, smooth without nodules Lymph: No cervical or inguinal lymphadenopathy. Skin: No rashes, bruises or suspicious lesions. Neurologic: Grossly intact, no focal deficits, moving all 4 extremities. Psychiatric: Normal mood and affect.  Laboratory Data:  Urinalysis Dipstick 1+ blood Microscopy negative WBC/RBC   Assessment & Plan:    - Asymptomatic microhematuria Low risk patient with 2 prior negative evaluations.  He has had recent upper tract imaging which shows no significant abnormalities.  He does not require reevaluation unless he develops gross hematuria, increase in degree of microhematuria or onset of new urologic symptoms.  Recommend he continue annual urinalysis with his PCP and return as needed for any of the above changes.  - BPH with lower urinary tract symptoms Mild voiding symptoms for several years which are stable.  PSA May 2020 was 0.98   Abbie Sons, MD  [facility] [address], [place], [place] [address] [phone]

## 2019-07-04 LAB — URINALYSIS, COMPLETE
Bilirubin, UA: NEGATIVE
Glucose, UA: NEGATIVE
Ketones, UA: NEGATIVE
Leukocytes,UA: NEGATIVE
Nitrite, UA: NEGATIVE
Protein,UA: NEGATIVE
Specific Gravity, UA: 1.015 (ref 1.005–1.030)
Urobilinogen, Ur: 0.2 mg/dL (ref 0.2–1.0)
pH, UA: 5.5 (ref 5.0–7.5)

## 2019-07-04 LAB — MICROSCOPIC EXAMINATION: Bacteria, UA: NONE SEEN

## 2019-08-06 IMAGING — MR MR HEAD WO/W CM
12 series · 48 of 48 positions shown · IV contrast (multihance)
Comparison: Head and cervical spine CT 04/04/2016. Brain MRI
without contrast 07/14/2012.

CLINICAL DATA: 56-year-old male with headache symptoms for 2 years.
Progressive headache symptoms and increased headache frequency in
the past 6 months. trigeminal autonomic cephalgias.

EXAM:
MRI HEAD WITHOUT AND WITH CONTRAST
TECHNIQUE: Multiplanar, multiecho pulse sequences of the brain and surrounding
structures were obtained without and with intravenous contrast.
CONTRAST:  17mL MULTIHANCE GADOBENATE DIMEGLUMINE 529 MG/ML IV SOLN

[Series 2: T1 · sagittal · 5.0mm · 0.45mm/px · 1 of 27 slices shown (1 of 2)]
[im 1/27]
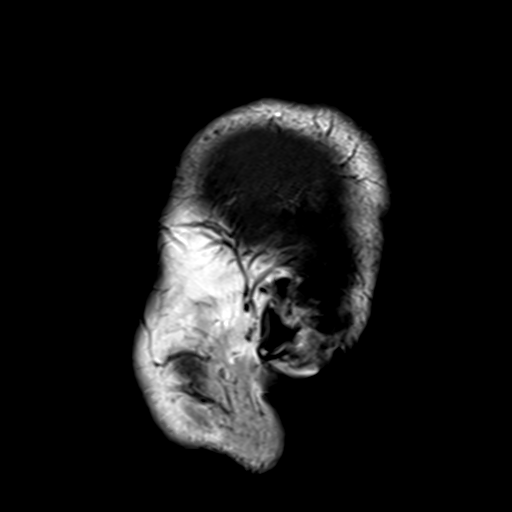

[Series 4: DWI · axial · 3.0mm · 1.80mm/px · z∈[-19,+142]mm · 4 of 55 slices shown (1 of 2)]
[im 1/55]
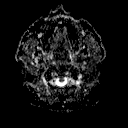
[im 19/55]
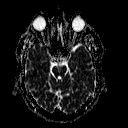
[im 37/55]
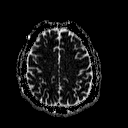
[im 55/55]
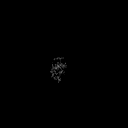

[Series 6: DWI · coronal · 3.0mm · 1.80mm/px · 3 of 45 slices shown (2 of 2)]
[im 1/45]
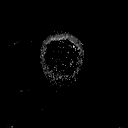
[im 23/45]
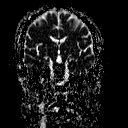
[im 45/45]
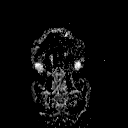

[Series 7: T2 · axial · 5.0mm · 0.60mm/px · z∈[-16,+139]mm · 2 of 25 slices shown (1 of 2)]
[im 1/25]
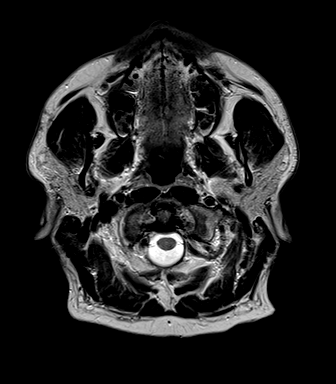
[im 25/25]
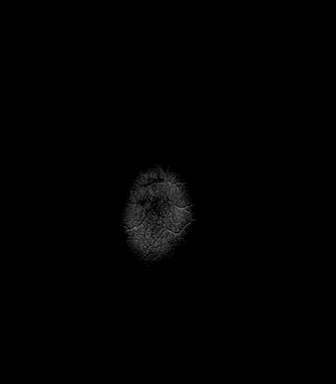

[Series 8: FLAIR · axial · 3.0mm · 0.45mm/px · z∈[-16,+139]mm · 3 of 53 slices shown]
[im 1/53]
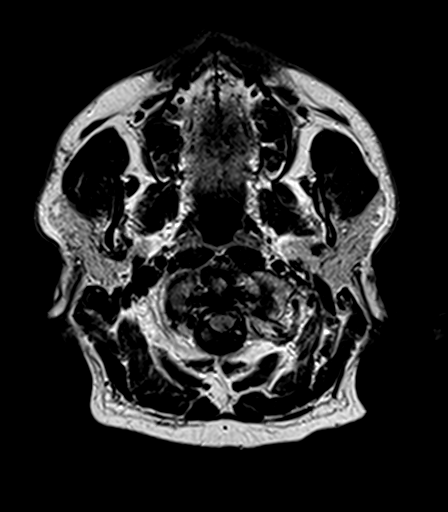
[im 27/53]
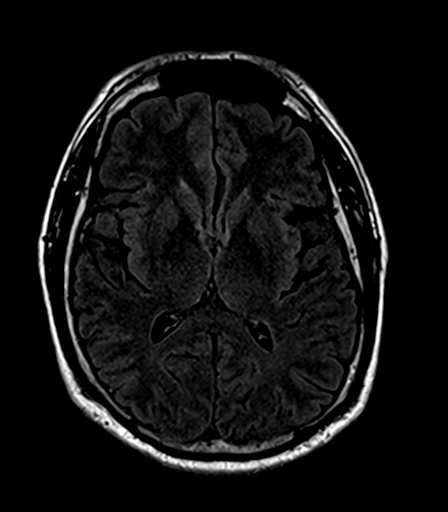
[im 53/53]
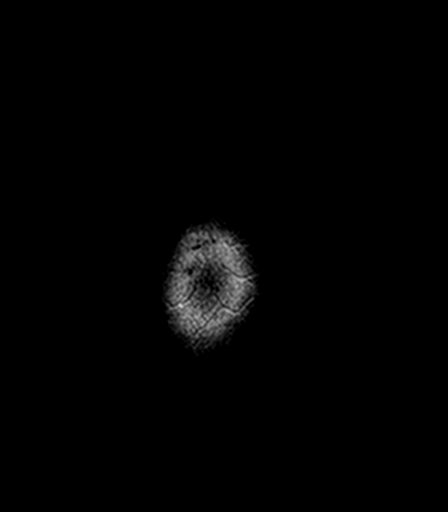

[Series 9: T2 · axial · 5.0mm · 0.45mm/px · z∈[-16,+139]mm · 2 of 25 slices shown (2 of 2)]
[im 1/25]
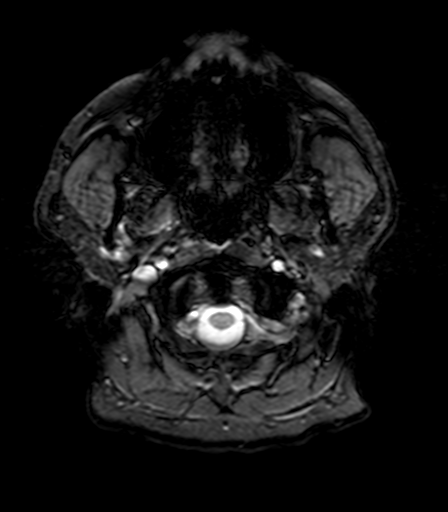
[im 25/25]
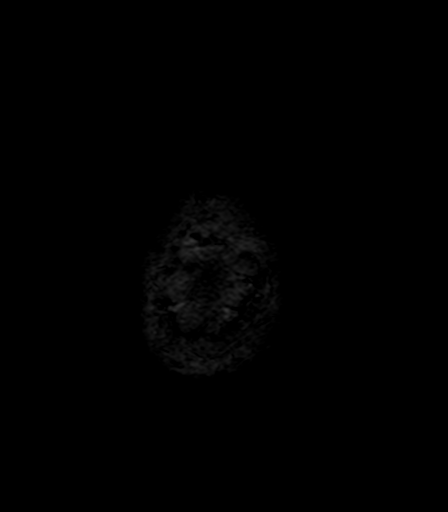

[Series 10: T1 · axial · 1.0mm · 1.00mm/px · z∈[-25,+150]mm · 11 of 176 slices shown (2 of 2)]
[im 1/176]
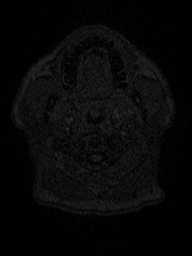
[im 18/176]
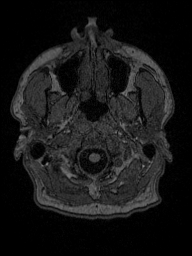
[im 36/176]
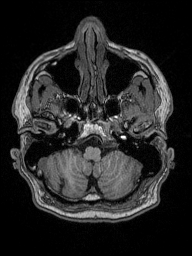
[im 53/176]
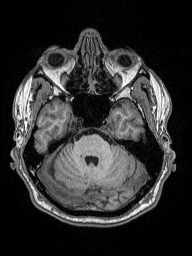
[im 71/176]
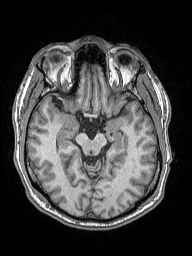
[im 88/176]
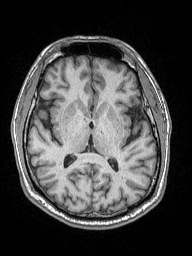
[im 106/176]
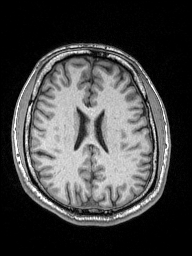
[im 123/176]
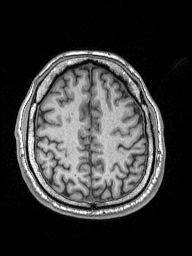
[im 141/176]
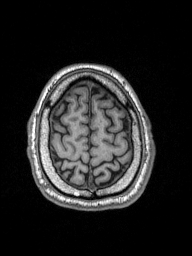
[im 158/176]
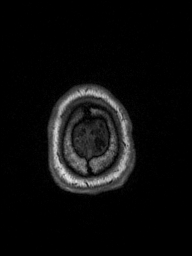
[im 176/176]
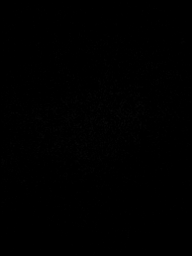

[Series 11: T2 post-contrast · coronal · 5.0mm · 0.49mm/px · 2 of 29 slices shown]
[im 1/29]
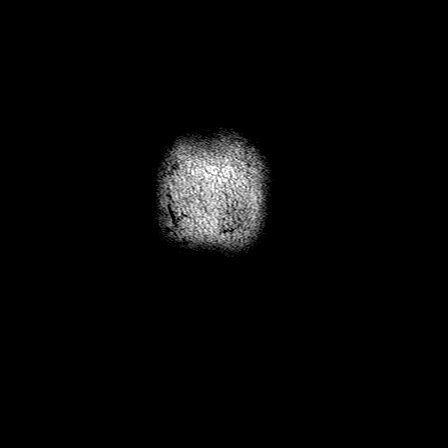
[im 29/29]
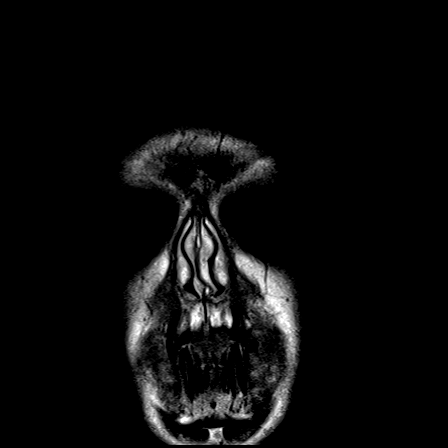

[Series 12: T1 post-contrast · axial · 1.0mm · 1.00mm/px · z∈[-25,+150]mm · 11 of 176 slices shown (1 of 2)]
[im 1/176]
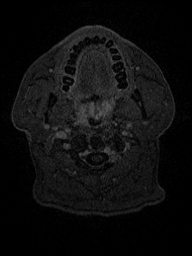
[im 18/176]
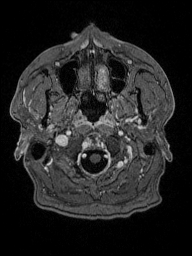
[im 36/176]
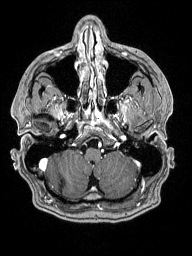
[im 53/176]
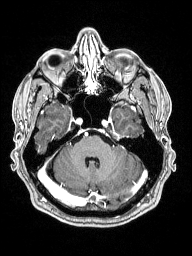
[im 71/176]
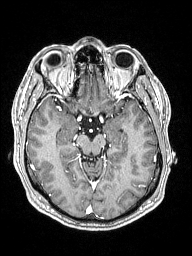
[im 88/176]
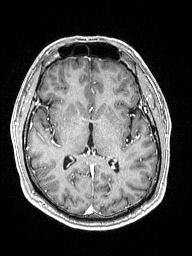
[im 106/176]
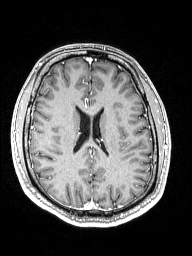
[im 123/176]
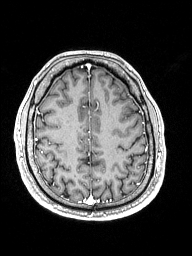
[im 141/176]
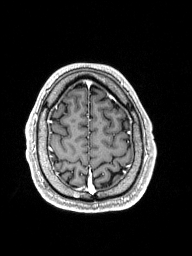
[im 158/176]
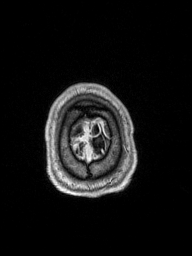
[im 176/176]
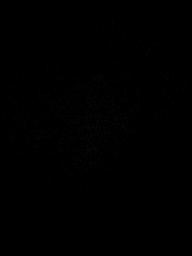

[Series 13: T1 post-contrast · coronal · 5.0mm · 0.43mm/px · 2 of 29 slices shown (2 of 2)]
[im 1/29]
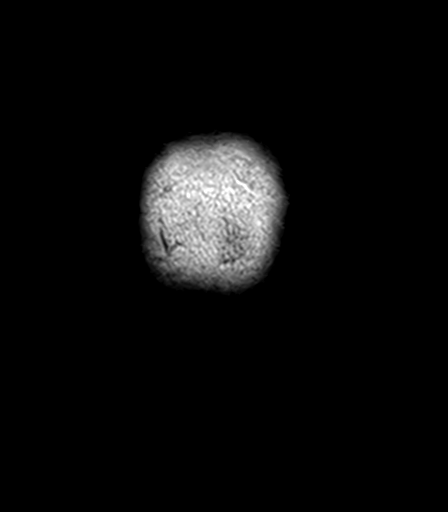
[im 29/29]
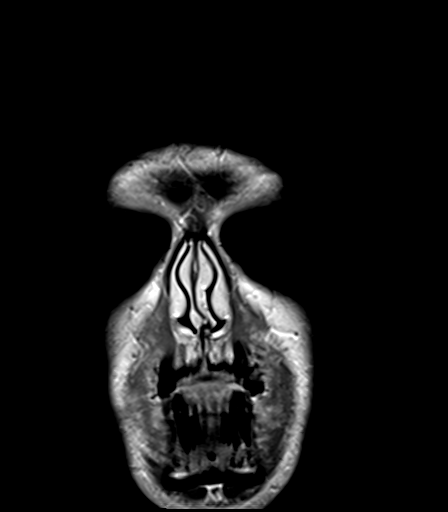

[Series 100: ax (id) · axial · 3.0mm · 1.80mm/px · z∈[-19,+142]mm · 4 of 55 slices shown]
[im 1/55]
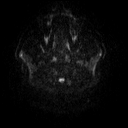
[im 19/55]
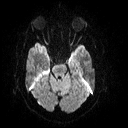
[im 37/55]
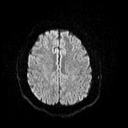
[im 55/55]
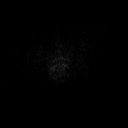

[Series 101: cor (id) · coronal · 3.0mm · 1.80mm/px · 3 of 43 slices shown]
[im 1/43]
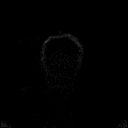
[im 22/43]
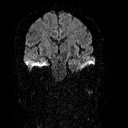
[im 43/43]
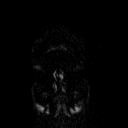

[48 of 48 positions shown; findings below may reference images not displayed]

FINDINGS: Brain: Cerebral volume is within normal limits. No restricted
diffusion to suggest acute infarction. No midline shift, mass
effect, evidence of mass lesion, ventriculomegaly, extra-axial
collection or acute intracranial hemorrhage. Cervicomedullary
junction and pituitary are within normal limits.

Gray and white matter signal throughout the brain is stable since
6854 and within normal limits for age. No cortical encephalomalacia
identified. No definite or chronic cerebral blood products. No
abnormal enhancement identified. No dural thickening

Vascular: Major intracranial vascular flow voids are stable since
6854 and appear normal aside from mild intracranial artery
tortuosity.

The major dural venous sinuses are enhancing and appear to be
patent.

Skull and upper cervical spine: Cervical disc degeneration with disc
bulging and endplate spurring at C3-C4 which appears to result in
mild degenerative cervical spinal stenosis on series 2, image 14.
Normal bone marrow signal.

Sinuses/Orbits: Postoperative changes to both globes, otherwise
normal orbits soft tissues. Paranasal Visualized paranasal sinuses
and mastoids are stable and well pneumatized.

Other: Visible internal auditory structures appear normal. Scalp and
face soft tissues appear negative.
IMPRESSION: 1. MRI appearance of the brain is stable since 6854 and normal for
age.
2. Mild degenerative cervical spinal stenosis suspected at C3-C4.

## 2019-08-30 ENCOUNTER — Other Ambulatory Visit: Payer: Self-pay | Admitting: Family Medicine

## 2019-08-30 DIAGNOSIS — E785 Hyperlipidemia, unspecified: Secondary | ICD-10-CM

## 2019-08-30 DIAGNOSIS — K449 Diaphragmatic hernia without obstruction or gangrene: Secondary | ICD-10-CM

## 2019-08-30 DIAGNOSIS — K219 Gastro-esophageal reflux disease without esophagitis: Secondary | ICD-10-CM

## 2020-02-12 ENCOUNTER — Other Ambulatory Visit: Payer: Self-pay

## 2020-02-12 ENCOUNTER — Ambulatory Visit (INDEPENDENT_AMBULATORY_CARE_PROVIDER_SITE_OTHER): Payer: 59 | Admitting: Gastroenterology

## 2020-02-12 ENCOUNTER — Encounter: Payer: Self-pay | Admitting: Gastroenterology

## 2020-02-12 VITALS — BP 123/79 | HR 98 | Temp 97.2°F | Ht 70.0 in | Wt 260.8 lb

## 2020-02-12 DIAGNOSIS — Z1211 Encounter for screening for malignant neoplasm of colon: Secondary | ICD-10-CM | POA: Diagnosis not present

## 2020-02-12 DIAGNOSIS — K601 Chronic anal fissure: Secondary | ICD-10-CM | POA: Diagnosis not present

## 2020-02-12 MED ORDER — SUPREP BOWEL PREP KIT 17.5-3.13-1.6 GM/177ML PO SOLN: 1.0000 | ORAL | 0 refills | Status: DC

## 2020-02-12 NOTE — Progress Notes (Signed)
    Primary Care Physician: Wilford Corner, PA-C  Primary Gastroenterologist:  Dr. Midge Minium  Chief Complaint  Patient presents with  . Follow-up    HPI: Charles Shepard is a 59 y.o. male here who has a history of anal fissures.  The patient was seen by me and sent to surgery for evaluation.  The patient was started on conservative therapy with a calcium channel blocker compounded by local pharmacy. The patient did not recall seeing surgery therefore when he started having symptoms recently he decided to make appointment with me.  He states that his symptoms are better now since he has been using warm compresses to clean himself and relieve the pain.  He continues to have rectal bleeding and is concerned that something is wrong.  Past Medical History:  Diagnosis Date  . Dyslipidemia   . GERD (gastroesophageal reflux disease)   . Hypertension     Current Outpatient Medications  Medication Sig Dispense Refill  . amLODipine (NORVASC) 5 MG tablet Take 5 mg by mouth daily.  11  . ASPIRIN LOW DOSE 81 MG EC tablet Take 81 mg by mouth daily.  1  . dexlansoprazole (DEXILANT) 60 MG capsule Take 1 capsule (60 mg total) by mouth daily. 90 capsule 1  . rosuvastatin (CRESTOR) 20 MG tablet TAKE 1 TABLET BY MOUTH EVERY DAY 90 tablet 1   No current facility-administered medications for this visit.    Allergies as of 02/12/2020  . (No Known Allergies)    ROS:  General: Negative for anorexia, weight loss, fever, chills, fatigue, weakness. ENT: Negative for hoarseness, difficulty swallowing , nasal congestion. CV: Negative for chest pain, angina, palpitations, dyspnea on exertion, peripheral edema.  Respiratory: Negative for dyspnea at rest, dyspnea on exertion, cough, sputum, wheezing.  GI: See history of present illness. GU:  Negative for dysuria, hematuria, urinary incontinence, urinary frequency, nocturnal urination.  Endo: Negative for unusual weight change.    Physical  Examination:   BP 123/79   Pulse 98   Temp (!) 97.2 F (36.2 C) (Temporal)   Ht 5\' 10"  (1.778 m)   Wt 260 lb 12.8 oz (118.3 kg)   BMI 37.42 kg/m   General: Well-nourished, well-developed in no acute distress.  Eyes: No icterus. Conjunctivae pink. Extremities: No lower extremity edema. No clubbing or deformities. Neuro: Alert and oriented x 3.  Grossly intact. Skin: Warm and dry, no jaundice.   Psych: Alert and cooperative, normal mood and affect.  Labs:    Imaging Studies: No results found.  Assessment and Plan:   Charles Shepard is a 59 y.o. y/o male who comes in with a history of anal fissures.  The patient will need to follow up with surgery again for possible anal myotomy.  Prior to that the patient should be set up for a screening colonoscopy to look for any other pathology prior to undergoing any surgical intervention.  The patient has been explained the plan and agrees with it.     59, MD. Midge Minium    Note: This dictation was prepared with Dragon dictation along with smaller phrase technology. Any transcriptional errors that result from this process are unintentional.

## 2020-02-26 ENCOUNTER — Other Ambulatory Visit: Payer: Self-pay

## 2020-02-26 ENCOUNTER — Encounter: Payer: Self-pay | Admitting: Gastroenterology

## 2020-02-28 ENCOUNTER — Encounter: Payer: Self-pay | Admitting: Gastroenterology

## 2020-02-29 ENCOUNTER — Other Ambulatory Visit
Admission: RE | Admit: 2020-02-29 | Discharge: 2020-02-29 | Disposition: A | Discharge status: Home / Self Care | Payer: 59 | Source: Ambulatory Visit | Attending: Gastroenterology | Admitting: Gastroenterology

## 2020-02-29 ENCOUNTER — Other Ambulatory Visit: Payer: Self-pay

## 2020-02-29 DIAGNOSIS — Z20822 Contact with and (suspected) exposure to covid-19: Secondary | ICD-10-CM | POA: Diagnosis not present

## 2020-02-29 DIAGNOSIS — Z01812 Encounter for preprocedural laboratory examination: Secondary | ICD-10-CM | POA: Insufficient documentation

## 2020-02-29 LAB — SARS CORONAVIRUS 2 (TAT 6-24 HRS): SARS Coronavirus 2: NEGATIVE

## 2020-03-01 NOTE — Discharge Instructions (Signed)
General Anesthesia, Adult, Care After This sheet gives you information about how to care for yourself after your procedure. Your health care provider may also give you more specific instructions. If you have problems or questions, contact your health care provider. What can I expect after the procedure? After the procedure, the following side effects are common:  Pain or discomfort at the IV site.  Nausea.  Vomiting.  Sore throat.  Trouble concentrating.  Feeling cold or chills.  Weak or tired.  Sleepiness and fatigue.  Soreness and body aches. These side effects can affect parts of the body that were not involved in surgery. Follow these instructions at home:  For at least 24 hours after the procedure:  Have a responsible adult stay with you. It is important to have someone help care for you until you are awake and alert.  Rest as needed.  Do not: ? Participate in activities in which you could fall or become injured. ? Drive. ? Use heavy machinery. ? Drink alcohol. ? Take sleeping pills or medicines that cause drowsiness. ? Make important decisions or sign legal documents. ? Take care of children on your own. Eating and drinking  Follow any instructions from your health care provider about eating or drinking restrictions.  When you feel hungry, start by eating small amounts of foods that are soft and easy to digest (bland), such as toast. Gradually return to your regular diet.  Drink enough fluid to keep your urine pale yellow.  If you vomit, rehydrate by drinking water, juice, or clear broth. General instructions  If you have sleep apnea, surgery and certain medicines can increase your risk for breathing problems. Follow instructions from your health care provider about wearing your sleep device: ? Anytime you are sleeping, including during daytime naps. ? While taking prescription pain medicines, sleeping medicines, or medicines that make you drowsy.  Return to  your normal activities as told by your health care provider. Ask your health care provider what activities are safe for you.  Take over-the-counter and prescription medicines only as told by your health care provider.  If you smoke, do not smoke without supervision.  Keep all follow-up visits as told by your health care provider. This is important. Contact a health care provider if:  You have nausea or vomiting that does not get better with medicine.  You cannot eat or drink without vomiting.  You have pain that does not get better with medicine.  You are unable to pass urine.  You develop a skin rash.  You have a fever.  You have redness around your IV site that gets worse. Get help right away if:  You have difficulty breathing.  You have chest pain.  You have blood in your urine or stool, or you vomit blood. Summary  After the procedure, it is common to have a sore throat or nausea. It is also common to feel tired.  Have a responsible adult stay with you for the first 24 hours after general anesthesia. It is important to have someone help care for you until you are awake and alert.  When you feel hungry, start by eating small amounts of foods that are soft and easy to digest (bland), such as toast. Gradually return to your regular diet.  Drink enough fluid to keep your urine pale yellow.  Return to your normal activities as told by your health care provider. Ask your health care provider what activities are safe for you. This information is not   intended to replace advice given to you by your health care provider. Make sure you discuss any questions you have with your health care provider. Document Revised: 10/29/2017 Document Reviewed: 06/11/2017 Elsevier Patient Education  2020 Elsevier Inc.  

## 2020-03-04 ENCOUNTER — Encounter
Admission: RE | Disposition: A | Discharge status: Home / Self Care | Payer: Self-pay | Source: Home / Self Care | Attending: Gastroenterology

## 2020-03-04 ENCOUNTER — Ambulatory Visit: Payer: 59 | Admitting: Anesthesiology

## 2020-03-04 ENCOUNTER — Encounter: Payer: Self-pay | Admitting: Gastroenterology

## 2020-03-04 ENCOUNTER — Ambulatory Visit
Admission: RE | Admit: 2020-03-04 | Discharge: 2020-03-04 | Disposition: A | Discharge status: Home / Self Care | Payer: 59 | Attending: Gastroenterology | Admitting: Gastroenterology

## 2020-03-04 DIAGNOSIS — Z1211 Encounter for screening for malignant neoplasm of colon: Secondary | ICD-10-CM | POA: Diagnosis present

## 2020-03-04 DIAGNOSIS — I10 Essential (primary) hypertension: Secondary | ICD-10-CM | POA: Diagnosis not present

## 2020-03-04 DIAGNOSIS — K219 Gastro-esophageal reflux disease without esophagitis: Secondary | ICD-10-CM | POA: Diagnosis not present

## 2020-03-04 DIAGNOSIS — Z87891 Personal history of nicotine dependence: Secondary | ICD-10-CM | POA: Insufficient documentation

## 2020-03-04 DIAGNOSIS — K621 Rectal polyp: Secondary | ICD-10-CM

## 2020-03-04 DIAGNOSIS — K635 Polyp of colon: Secondary | ICD-10-CM

## 2020-03-04 DIAGNOSIS — Z79899 Other long term (current) drug therapy: Secondary | ICD-10-CM | POA: Insufficient documentation

## 2020-03-04 DIAGNOSIS — E785 Hyperlipidemia, unspecified: Secondary | ICD-10-CM | POA: Diagnosis not present

## 2020-03-04 HISTORY — DX: Other trigeminal autonomic cephalgias (tac), not intractable: G44.099

## 2020-03-04 HISTORY — DX: Motion sickness, initial encounter: T75.3XXA

## 2020-03-04 HISTORY — DX: Cardiac murmur, unspecified: R01.1

## 2020-03-04 HISTORY — PX: COLONOSCOPY WITH PROPOFOL: SHX5780

## 2020-03-04 SURGERY — COLONOSCOPY WITH PROPOFOL
Anesthesia: General | Site: Rectum

## 2020-03-04 MED ORDER — STERILE WATER FOR IRRIGATION IR SOLN
Status: DC | PRN
  Administered 2020-03-04: 50 mL

## 2020-03-04 MED ORDER — LACTATED RINGERS IV SOLN: INTRAVENOUS | Status: DC

## 2020-03-04 MED ORDER — ACETAMINOPHEN 325 MG PO TABS: 325.0000 mg | ORAL_TABLET | ORAL | Status: DC | PRN

## 2020-03-04 MED ORDER — LIDOCAINE HCL (CARDIAC) PF 100 MG/5ML IV SOSY
PREFILLED_SYRINGE | INTRAVENOUS | Status: DC | PRN
  Administered 2020-03-04: 30 mg via INTRAVENOUS

## 2020-03-04 MED ORDER — PROPOFOL 10 MG/ML IV BOLUS
INTRAVENOUS | Status: DC | PRN
  Administered 2020-03-04: 40 mg via INTRAVENOUS
  Administered 2020-03-04: 150 mg via INTRAVENOUS
  Administered 2020-03-04: 50 mg via INTRAVENOUS
  Administered 2020-03-04: 40 mg via INTRAVENOUS

## 2020-03-04 MED ORDER — ACETAMINOPHEN 160 MG/5ML PO SOLN: 325.0000 mg | ORAL | Status: DC | PRN

## 2020-03-04 MED ORDER — ONDANSETRON HCL 4 MG/2ML IJ SOLN: 4.0000 mg | Freq: Once | INTRAMUSCULAR | Status: DC | PRN

## 2020-03-04 SURGICAL SUPPLY — 7 items
GAUZE SPONGE 4X4 12PLY STRL (GAUZE/BANDAGES/DRESSINGS) ×2 IMPLANT
GOWN CVR UNV OPN BCK APRN NK (MISCELLANEOUS) ×2 IMPLANT
GOWN ISOL THUMB LOOP REG UNIV (MISCELLANEOUS) ×6
KIT ENDO PROCEDURE OLY (KITS) ×3 IMPLANT
SNARE SHORT THROW 13M SML OVAL (MISCELLANEOUS) ×2 IMPLANT
TRAP ETRAP POLY (MISCELLANEOUS) ×2 IMPLANT
WATER STERILE IRR 250ML POUR (IV SOLUTION) ×3 IMPLANT

## 2020-03-04 NOTE — Op Note (Signed)
[facility] Patient Name: Charles Shepard Procedure Date: 03/04/2020 10:38 AM MRN: [id number] Account #: [id number] Date of Birth: 1961-10-17 Admit Type: Outpatient Age: 59 Room: [facility] OR ROOM 01 Gender: Male Note Status: Finalized Procedure:             Colonoscopy Indications:           Screening for colorectal malignant neoplasm Providers:             Lucilla Lame MD, MD Referring MD:          Leonie Douglas. Doy Hutching, MD (Referring MD) Medicines:             Propofol per Anesthesia Complications:         No immediate complications. Procedure:             Pre-Anesthesia Assessment:                        - Prior to the procedure, a History and Physical was                         performed, and patient medications and allergies were                         reviewed. The patient's tolerance of previous                         anesthesia was also reviewed. The risks and benefits                         of the procedure and the sedation options and risks                         were discussed with the patient. All questions were                         answered, and informed consent was obtained. Prior                         Anticoagulants: The patient has taken no previous                         anticoagulant or antiplatelet agents. ASA Grade                         Assessment: II - A patient with mild systemic disease.                         After reviewing the risks and benefits, the patient                         was deemed in satisfactory condition to undergo the                         procedure.                        After obtaining informed consent, the colonoscope was  passed under direct vision. Throughout the procedure,                         the patient's blood pressure, pulse, and oxygen                         saturations were monitored continuously. The was                         introduced through the anus and  advanced to the the                         cecum, identified by appendiceal orifice and ileocecal                         valve. The colonoscopy was performed without                         difficulty. The patient tolerated the procedure well.                         The quality of the bowel preparation was excellent. Findings:      The perianal and digital rectal examinations were normal.      A 5 mm polyp was found in the descending colon. The polyp was sessile.       The polyp was removed with a cold snare. Resection and retrieval were       complete.      A 3 mm polyp was found in the rectum. The polyp was sessile. The polyp       was removed with a cold snare. Resection and retrieval were complete. Impression:            - One 5 mm polyp in the descending colon, removed with                         a cold snare. Resected and retrieved.                        - One 3 mm polyp in the rectum, removed with a cold                         snare. Resected and retrieved. Recommendation:        - Discharge patient to home.                        - Resume previous diet.                        - Continue present medications.                        - Await pathology results.                        - Repeat colonoscopy in 5 years if polyp adenoma and                         10 years if hyperplastic Procedure Code(s):     --- Professional ---  15056, Colonoscopy, flexible; with removal of                         tumor(s), polyp(s), or other lesion(s) by snare                         technique Diagnosis Code(s):     --- Professional ---                        Z12.11, Encounter for screening for malignant neoplasm                         of colon                        K63.5, Polyp of colon                        K62.1, Rectal polyp CPT copyright 2019 American Medical Association. All rights reserved. The codes documented in this report are preliminary and upon coder  review may  be revised to meet current compliance requirements. Midge Minium MD, MD 03/04/2020 10:59:11 AM This report has been signed electronically. Number of Addenda: 0 Note Initiated On: 03/04/2020 10:38 AM Scope Withdrawal Time: 0 hours 8 minutes 4 seconds  Total Procedure Duration: 0 hours 9 minutes 25 seconds  Estimated Blood Loss:  Estimated blood loss: none.      [facility]

## 2020-03-04 NOTE — H&P (Signed)
 Darren Wohl, MD FACG [address] [place], [place] [address] Phone: [phone] Fax : [phone]  Primary Care Physician:  Whitaker, Jason Hestle, PA-C Primary Gastroenterologist:  Dr. Wohl  Pre-Procedure History & Physical: HPI:  Charles Shepard is a 58 y.o. male is here for a screening colonoscopy.   Past Medical History:  Diagnosis Date  . Dyslipidemia   . GERD (gastroesophageal reflux disease)   . Heart murmur   . Hypertension   . Motion sickness    amusement park rides  . Trigeminal autonomic cephalgias     Past Surgical History:  Procedure Laterality Date  . APPENDECTOMY    . broken leg  1999   Left, has a rod and a screw from ankle to knee  . CERVICAL DISCECTOMY  2008   c-5  . COLONOSCOPY  2014   Dr Wohl    Prior to Admission medications   Medication Sig Start Date End Date Taking? Authorizing Provider  amLODipine (NORVASC) 5 MG tablet Take 5 mg by mouth daily. 01/19/18  Yes [provider]  ASPIRIN LOW DOSE 81 MG EC tablet Take 81 mg by mouth daily. 09/16/18  Yes [provider]  dexlansoprazole (DEXILANT) 60 MG capsule Take 1 capsule (60 mg total) by mouth daily. 10/24/18  Yes Karamalegos, Alexander J, DO  Na Sulfate-K Sulfate-Mg Sulf (SUPREP BOWEL PREP KIT) 17.5-3.13-1.6 GM/177ML SOLN Take 1 kit by mouth as directed. 02/12/20  Yes Wohl, Darren, MD  rosuvastatin (CRESTOR) 20 MG tablet TAKE 1 TABLET BY MOUTH EVERY DAY 03/05/19  Yes Karamalegos, Alexander J, DO  ALPRAZolam (XANAX) 0.5 MG tablet Take 0.5 mg by mouth at bedtime as needed for anxiety.    [provider]  gabapentin (NEURONTIN) 300 MG capsule Take 300 mg by mouth 3 (three) times daily as needed.    [provider]    Allergies as of 02/12/2020  . (No Known Allergies)    Family History  Problem Relation Age of Onset  . Hyperlipidemia Mother   . Diabetes Paternal Aunt   . Colon cancer Neg Hx     Social History   Socioeconomic History  . Marital  status: Married    Spouse name: Not on file  . Number of children: Not on file  . Years of education: Not on file  . Highest education level: Not on file  Occupational History  . Occupation: Installs technical textile machines    Comment: Travels out of state often to install machines  Tobacco Use  . Smoking status: Former Smoker    Packs/day: 0.50    Years: 10.00    Pack years: 5.00    Types: Cigarettes    Quit date: 11/10/2007    Years since quitting: 12.3  . Smokeless tobacco: Former User  Substance and Sexual Activity  . Alcohol use: Yes    Alcohol/week: 16.0 standard drinks    Types: 16 Cans of beer per week  . Drug use: No  . Sexual activity: Not on file  Other Topics Concern  . Not on file  Social History Narrative  . Not on file   Social Determinants of Health   Financial Resource Strain:   . Difficulty of Paying Living Expenses:   Food Insecurity:   . Worried About Running Out of Food in the Last Year:   . Ran Out of Food in the Last Year:   Transportation Needs:   . Lack of Transportation (Medical):   . Lack of Transportation (Non-Medical):     Physical Activity:   . Days of Exercise per Week:   . Minutes of Exercise per Session:   Stress:   . Feeling of Stress :   Social Connections:   . Frequency of Communication with Friends and Family:   . Frequency of Social Gatherings with Friends and Family:   . Attends Religious Services:   . Active Member of Clubs or Organizations:   . Attends Club or Organization Meetings:   . Marital Status:   Intimate Partner Violence:   . Fear of Current or Ex-Partner:   . Emotionally Abused:   . Physically Abused:   . Sexually Abused:     Review of Systems: See HPI, otherwise negative ROS  Physical Exam: BP (!) 130/95   Pulse 76   Temp 98.2 F (36.8 C) (Temporal)   Resp 16   Ht 5' 10" (1.778 m)   Wt 86.2 kg   SpO2 97%   BMI 27.28 kg/m  General:   Alert,  pleasant and cooperative in NAD Head:  Normocephalic  and atraumatic. Neck:  Supple; no masses or thyromegaly. Lungs:  Clear throughout to auscultation.    Heart:  Regular rate and rhythm. Abdomen:  Soft, nontender and nondistended. Normal bowel sounds, without guarding, and without rebound.   Neurologic:  Alert and  oriented x4;  grossly normal neurologically.  Impression/Plan: Charles Shepard is now here to undergo a screening colonoscopy.  Risks, benefits, and alternatives regarding colonoscopy have been reviewed with the patient.  Questions have been answered.  All parties agreeable. 

## 2020-03-04 NOTE — Transfer of Care (Signed)
Immediate Anesthesia Transfer of Care Note  Patient: Charles Shepard  Procedure(s) Performed: COLONOSCOPY WITH PROPOFOL (N/A Rectum)  Patient Location: PACU  Anesthesia Type: General  Level of Consciousness: awake, alert  and patient cooperative  Airway and Oxygen Therapy: Patient Spontanous Breathing and Patient connected to supplemental oxygen  Post-op Assessment: Post-op Vital signs reviewed, Patient's Cardiovascular Status Stable, Respiratory Function Stable, Patent Airway and No signs of Nausea or vomiting  Post-op Vital Signs: Reviewed and stable  Complications: No apparent anesthesia complications

## 2020-03-04 NOTE — Anesthesia Postprocedure Evaluation (Signed)
Anesthesia Post Note  Patient: Charles Shepard  Procedure(s) Performed: COLONOSCOPY WITH PROPOFOL (N/A Rectum)     Patient location during evaluation: PACU Anesthesia Type: General Level of consciousness: awake and alert Pain management: pain level controlled Vital Signs Assessment: post-procedure vital signs reviewed and stable Respiratory status: spontaneous breathing, nonlabored ventilation, respiratory function stable and patient connected to nasal cannula oxygen Cardiovascular status: blood pressure returned to baseline and stable Postop Assessment: no apparent nausea or vomiting Anesthetic complications: no    Wille Celeste Kalob Bergen

## 2020-03-04 NOTE — Anesthesia Preprocedure Evaluation (Signed)
Anesthesia Evaluation  Patient identified by MRN, date of birth, ID band Patient awake    History of Anesthesia Complications Negative for: history of anesthetic complications  Airway Mallampati: II  TM Distance: >3 FB Neck ROM: Full    Dental no notable dental hx.    Pulmonary Patient abstained from smoking., former smoker,    Pulmonary exam normal        Cardiovascular hypertension, Normal cardiovascular exam     Neuro/Psych negative neurological ROS     GI/Hepatic Neg liver ROS, GERD  Medicated,  Endo/Other  negative endocrine ROS  Renal/GU negative Renal ROS     Musculoskeletal   Abdominal   Peds  Hematology negative hematology ROS (+)   Anesthesia Other Findings   Reproductive/Obstetrics                             Anesthesia Physical Anesthesia Plan  ASA: II  Anesthesia Plan: General   Post-op Pain Management:    Induction: Intravenous  PONV Risk Score and Plan: 2 and Treatment may vary due to age or medical condition  Airway Management Planned: Nasal Cannula and Natural Airway  Additional Equipment: None  Intra-op Plan:   Post-operative Plan:   Informed Consent: I have reviewed the patients History and Physical, chart, labs and discussed the procedure including the risks, benefits and alternatives for the proposed anesthesia with the patient or authorized representative who has indicated his/her understanding and acceptance.       Plan Discussed with: CRNA  Anesthesia Plan Comments:         Anesthesia Quick Evaluation

## 2020-03-04 NOTE — Anesthesia Procedure Notes (Signed)
Performed by: Christionna Poland, CRNA Pre-anesthesia Checklist: Patient identified, Emergency Drugs available, Suction available, Timeout performed and Patient being monitored Patient Re-evaluated:Patient Re-evaluated prior to induction Oxygen Delivery Method: Nasal cannula Placement Confirmation: positive ETCO2       

## 2020-03-05 ENCOUNTER — Encounter: Payer: Self-pay | Admitting: *Deleted

## 2020-03-05 LAB — SURGICAL PATHOLOGY

## 2020-03-06 ENCOUNTER — Encounter: Payer: Self-pay | Admitting: Gastroenterology

## 2020-10-16 ENCOUNTER — Telehealth (HOSPITAL_COMMUNITY): Payer: Self-pay | Admitting: Family

## 2020-10-16 ENCOUNTER — Other Ambulatory Visit (HOSPITAL_COMMUNITY): Payer: Self-pay | Admitting: Family

## 2020-10-16 DIAGNOSIS — U071 COVID-19: Secondary | ICD-10-CM

## 2020-10-16 NOTE — Telephone Encounter (Signed)
Called to discuss with Charles Shepard about Covid symptoms and potential candidacy for the use of sotrovimab, a combination monoclonal antibody infusion for those with mild to moderate Covid symptoms and at a high risk of hospitalization.     Pt is qualified for this infusion at the infusion center due to co-morbid conditions and/or a member of an at-risk group and wants the infusion.  Charles Judon,NP

## 2020-10-16 NOTE — Progress Notes (Signed)
I connected by phone with Charles Shepard on 10/16/2020 at 7:02 PM to discuss the potential use of a new treatment for mild to moderate COVID-19 viral infection in non-hospitalized patients.  This patient is a 59 y.o. male that meets the FDA criteria for Emergency Use Authorization of COVID monoclonal antibody casirivimab/imdevimab, bamlanivimab/eteseviamb, or sotrovimab.  Has a (+) direct SARS-CoV-2 viral test result  Has mild or moderate COVID-19   Is NOT hospitalized due to COVID-19  Is within 10 days of symptom onset  Has at least one of the high risk factor(s) for progression to severe COVID-19 and/or hospitalization as defined in EUA.  Specific high risk criteria : Cardiovascular disease or hypertension   Symptoms of stuffy nose and fever began 10/09/20.    I have spoken and communicated the following to the patient or parent/caregiver regarding COVID monoclonal antibody treatment:  1. FDA has authorized the emergency use for the treatment of mild to moderate COVID-19 in adults and pediatric patients with positive results of direct SARS-CoV-2 viral testing who are 30 years of age and older weighing at least 40 kg, and who are at high risk for progressing to severe COVID-19 and/or hospitalization.  2. The significant known and potential risks and benefits of COVID monoclonal antibody, and the extent to which such potential risks and benefits are unknown.  3. Information on available alternative treatments and the risks and benefits of those alternatives, including clinical trials.  4. Patients treated with COVID monoclonal antibody should continue to self-isolate and use infection control measures (e.g., wear mask, isolate, social distance, avoid sharing personal items, clean and disinfect "high touch" surfaces, and frequent handwashing) according to CDC guidelines.   5. The patient or parent/caregiver has the option to accept or refuse COVID monoclonal antibody treatment.  After  reviewing this information with the patient, the patient has agreed to receive one of the available covid 19 monoclonal antibodies and will be provided an appropriate fact sheet prior to infusion. [name], NP 10/16/2020 7:02 PM

## 2020-10-18 ENCOUNTER — Ambulatory Visit (HOSPITAL_COMMUNITY)
Admission: RE | Admit: 2020-10-18 | Discharge: 2020-10-18 | Disposition: A | Discharge status: Home / Self Care | Payer: 59 | Source: Ambulatory Visit | Attending: Pulmonary Disease | Admitting: Pulmonary Disease

## 2020-10-18 DIAGNOSIS — U071 COVID-19: Secondary | ICD-10-CM | POA: Diagnosis not present

## 2020-10-18 MED ORDER — DIPHENHYDRAMINE HCL 50 MG/ML IJ SOLN: 50.0000 mg | Freq: Once | INTRAMUSCULAR | Status: DC | PRN

## 2020-10-18 MED ORDER — SODIUM CHLORIDE 0.9 % IV SOLN: INTRAVENOUS | Status: DC | PRN

## 2020-10-18 MED ORDER — FAMOTIDINE IN NACL 20-0.9 MG/50ML-% IV SOLN: 20.0000 mg | Freq: Once | INTRAVENOUS | Status: DC | PRN

## 2020-10-18 MED ORDER — SODIUM CHLORIDE 0.9 % IV SOLN
1200.0000 mg | Freq: Once | INTRAVENOUS | Status: AC
  Administered 2020-10-18: 1200 mg via INTRAVENOUS

## 2020-10-18 MED ORDER — EPINEPHRINE 0.3 MG/0.3ML IJ SOAJ: 0.3000 mg | Freq: Once | INTRAMUSCULAR | Status: DC | PRN

## 2020-10-18 MED ORDER — ALBUTEROL SULFATE HFA 108 (90 BASE) MCG/ACT IN AERS: 2.0000 | INHALATION_SPRAY | Freq: Once | RESPIRATORY_TRACT | Status: DC | PRN

## 2020-10-18 MED ORDER — METHYLPREDNISOLONE SODIUM SUCC 125 MG IJ SOLR: 125.0000 mg | Freq: Once | INTRAMUSCULAR | Status: DC | PRN

## 2020-10-18 NOTE — Discharge Instructions (Signed)
10 Things You Can Do to Manage Your COVID-19 Symptoms at Home If you have possible or confirmed COVID-19: 1. Stay home from work and school. And stay away from other public places. If you must go out, avoid using any kind of public transportation, ridesharing, or taxis. 2. Monitor your symptoms carefully. If your symptoms get worse, call your healthcare provider immediately. 3. Get rest and stay hydrated. 4. If you have a medical appointment, call the healthcare provider ahead of time and tell them that you have or may have COVID-19. 5. For medical emergencies, call 911 and notify the dispatch personnel that you have or may have COVID-19. 6. Cover your cough and sneezes with a tissue or use the inside of your elbow. 7. Wash your hands often with soap and water for at least 20 seconds or clean your hands with an alcohol-based hand sanitizer that contains at least 60% alcohol. 8. As much as possible, stay in a specific room and away from other people in your home. Also, you should use a separate bathroom, if available. If you need to be around other people in or outside of the home, wear a mask. 9. Avoid sharing personal items with other people in your household, like dishes, towels, and bedding. 10. Clean all surfaces that are touched often, like counters, tabletops, and doorknobs. Use household cleaning sprays or wipes according to the label instructions. cdc.gov/coronavirus 05/10/2019 This information is not intended to replace advice given to you by your health care provider. Make sure you discuss any questions you have with your health care provider. Document Revised: 10/12/2019 Document Reviewed: 10/12/2019 Elsevier Patient Education  2020 Elsevier Inc. What types of side effects do monoclonal antibody drugs cause?  Common side effects  In general, the more common side effects caused by monoclonal antibody drugs include: . Allergic reactions, such as hives or itching . Flu-like signs and  symptoms, including chills, fatigue, fever, and muscle aches and pains . Nausea, vomiting . Diarrhea . Skin rashes . Low blood pressure   The CDC is recommending patients who receive monoclonal antibody treatments wait at least 90 days before being vaccinated.  Currently, there are no data on the safety and efficacy of mRNA COVID-19 vaccines in persons who received monoclonal antibodies or convalescent plasma as part of COVID-19 treatment. Based on the estimated half-life of such therapies as well as evidence suggesting that reinfection is uncommon in the 90 days after initial infection, vaccination should be deferred for at least 90 days, as a precautionary measure until additional information becomes available, to avoid interference of the antibody treatment with vaccine-induced immune responses. If you have any questions or concerns after the infusion please call the Advanced Practice Provider on call at [phone]. This number is ONLY intended for your use regarding questions or concerns about the infusion post-treatment side-effects.  Please do not provide this number to others for use. For return to work notes please contact your primary care provider.   If someone you know is interested in receiving treatment please have them call the COVID hotline at [phone].   

## 2020-10-18 NOTE — Progress Notes (Signed)
Patient reviewed Fact Sheet for Patients, Parents, and Caregivers for Emergency Use Authorization (EUA) of Casi/Regen for the Treatment of Coronavirus. Patient also reviewed and is agreeable to the estimated cost of treatment. Patient is agreeable to proceed.    

## 2020-10-18 NOTE — Progress Notes (Signed)
  Diagnosis: COVID-19  Physician: Dr. Wright   Procedure: [facility] Med: casirivimab\imdevimab infusion - Provided patient with casirivimab\imdevimab fact sheet for patients, parents and caregivers prior to infusion.  Complications: No immediate complications noted.  Discharge: Discharged home   Charles Shepard  [name] 10/18/2020   

## 2020-11-07 DIAGNOSIS — M5412 Radiculopathy, cervical region: Secondary | ICD-10-CM | POA: Insufficient documentation

## 2020-11-07 DIAGNOSIS — R030 Elevated blood-pressure reading, without diagnosis of hypertension: Secondary | ICD-10-CM | POA: Insufficient documentation

## 2021-01-31 ENCOUNTER — Ambulatory Visit: Admit: 2021-01-31 | Payer: 59 | Admitting: Neurological Surgery

## 2021-01-31 SURGERY — POSTERIOR CERVICAL LAMINECTOMY
Anesthesia: General | Laterality: Right

## 2021-02-11 DIAGNOSIS — G5623 Lesion of ulnar nerve, bilateral upper limbs: Secondary | ICD-10-CM | POA: Insufficient documentation

## 2021-03-25 DIAGNOSIS — M542 Cervicalgia: Secondary | ICD-10-CM | POA: Insufficient documentation

## 2021-04-14 ENCOUNTER — Encounter: Payer: Self-pay | Admitting: Gastroenterology

## 2021-04-14 ENCOUNTER — Ambulatory Visit (INDEPENDENT_AMBULATORY_CARE_PROVIDER_SITE_OTHER): Payer: 59 | Admitting: Gastroenterology

## 2021-04-14 ENCOUNTER — Other Ambulatory Visit: Payer: Self-pay

## 2021-04-14 VITALS — BP 129/76 | HR 81 | Temp 98.6°F | Ht 70.0 in | Wt 193.0 lb

## 2021-04-14 DIAGNOSIS — K601 Chronic anal fissure: Secondary | ICD-10-CM

## 2021-04-14 DIAGNOSIS — G540 Brachial plexus disorders: Secondary | ICD-10-CM | POA: Insufficient documentation

## 2021-04-14 NOTE — Progress Notes (Signed)
Primary Care Physician: Wilford Corner, PA-C  Primary Gastroenterologist:  Dr. Midge Minium  Chief Complaint  Patient presents with  . anal fissures    HPI: Charles Shepard is a 60 y.o. male here with continued problems with anal fissures.  The patient was seen by me twice in 2019 for rectal bleeding and itching. The patient was sent to surgery and recommended to have a colonoscopy.  The patient had a colonoscopy in 2021 with hyperplastic polyps found. The patient was referred to surgery for a myotomy due to failure of medical treatment.  The patient has also been following up with neurosurgery for chronic pain without relief from surgery. He has been diagnosed with cervical radiculopathy.  He had reported to his primary care physician that he wanted to follow up with GI again because of his continued issues with anal fissures. The patient has been tried on topical therapies including calcium channel blockers made at a compounding pharmacy. The patient reports that he also is considering definitive treatment for his chronic reflux.  The patient states that he wakes up in the morning with nausea.  There is no report of any dysphagia or unexplained weight loss.  He also denies fevers or chills.   Past Medical History:  Diagnosis Date  . Dyslipidemia   . GERD (gastroesophageal reflux disease)   . Heart murmur   . Hypertension   . Motion sickness    amusement park rides  . Trigeminal autonomic cephalgias     Current Outpatient Medications  Medication Sig Dispense Refill  . ALPRAZolam (XANAX) 0.5 MG tablet Take 0.5 mg by mouth at bedtime as needed for anxiety.    Marland Kitchen amLODipine (NORVASC) 5 MG tablet Take 5 mg by mouth daily.  11  . ASPIRIN LOW DOSE 81 MG EC tablet Take 81 mg by mouth daily.  1  . dexlansoprazole (DEXILANT) 60 MG capsule Take 1 capsule (60 mg total) by mouth daily. 90 capsule 1  . gabapentin (NEURONTIN) 300 MG capsule Take 300 mg by mouth 3 (three) times daily as  needed.    Marland Kitchen HYDROcodone-acetaminophen (NORCO/VICODIN) 5-325 MG tablet Take 1 tablet by mouth every 6 (six) hours as needed.    . methocarbamol (ROBAXIN) 750 MG tablet Take 750 mg by mouth every 8 (eight) hours as needed.    . rosuvastatin (CRESTOR) 20 MG tablet TAKE 1 TABLET BY MOUTH EVERY DAY 90 tablet 1   No current facility-administered medications for this visit.    Allergies as of 04/14/2021  . (No Known Allergies)    ROS:  General: Negative for anorexia, weight loss, fever, chills, fatigue, weakness. ENT: Negative for hoarseness, difficulty swallowing , nasal congestion. CV: Negative for chest pain, angina, palpitations, dyspnea on exertion, peripheral edema.  Respiratory: Negative for dyspnea at rest, dyspnea on exertion, cough, sputum, wheezing.  GI: See history of present illness. GU:  Negative for dysuria, hematuria, urinary incontinence, urinary frequency, nocturnal urination.  Endo: Negative for unusual weight change.    Physical Examination:   BP 129/76   Pulse 81   Temp 98.6 F (37 C) (Temporal)   Ht 5\' 10"  (1.778 m)   Wt 193 lb (87.5 kg)   BMI 27.69 kg/m   General: Well-nourished, well-developed in no acute distress.  Eyes: No icterus. Conjunctivae pink. Neuro: Alert and oriented x 3.  Grossly intact. Skin: Warm and dry, no jaundice.   Psych: Alert and cooperative, normal mood and affect.  Labs:    Imaging Studies:  No results found.  Assessment and Plan:   Charles Shepard is a 60 y.o. y/o male who comes in today with a history of anal fissures.  The patient has been refractory to medical therapies. The patient would like to proceed with seeing surgery again for surgical treatment for his anal fissures.  The patient also would like to discuss possible antireflux surgery.  The patient will be set up with Dr. Everlene Farrier for reevaluation.  The patient has been explained the plan and agrees with it     Midge Minium, MD. [name]    Note: This dictation was  prepared with Dragon dictation along with smaller phrase technology. Any transcriptional errors that result from this process are unintentional.

## 2021-04-17 DIAGNOSIS — M7918 Myalgia, other site: Secondary | ICD-10-CM | POA: Insufficient documentation

## 2021-04-28 DIAGNOSIS — M79643 Pain in unspecified hand: Secondary | ICD-10-CM | POA: Insufficient documentation

## 2021-04-28 DIAGNOSIS — G5600 Carpal tunnel syndrome, unspecified upper limb: Secondary | ICD-10-CM | POA: Insufficient documentation

## 2021-05-01 DIAGNOSIS — Z6828 Body mass index (BMI) 28.0-28.9, adult: Secondary | ICD-10-CM | POA: Insufficient documentation

## 2021-05-05 ENCOUNTER — Encounter: Payer: Self-pay | Admitting: Surgery

## 2021-05-05 ENCOUNTER — Telehealth: Payer: Self-pay | Admitting: Surgery

## 2021-05-05 ENCOUNTER — Ambulatory Visit (INDEPENDENT_AMBULATORY_CARE_PROVIDER_SITE_OTHER): Payer: 59 | Admitting: Surgery

## 2021-05-05 ENCOUNTER — Other Ambulatory Visit: Payer: Self-pay

## 2021-05-05 VITALS — BP 112/78 | HR 97 | Temp 98.3°F | Ht 71.0 in | Wt 190.2 lb

## 2021-05-05 DIAGNOSIS — K602 Anal fissure, unspecified: Secondary | ICD-10-CM | POA: Diagnosis not present

## 2021-05-05 NOTE — H&P (View-Only) (Signed)
Outpatient Surgical Follow Up  05/05/2021  Charles Shepard is an 59 y.o. male.   Chief Complaint  Patient presents with   New Patient (Initial Visit)    Anal fissure    HPI: History is a 60 year old male that is 17 years ago for an anal fissure and prescribed nifedipine cream.  He continues to have some intermittent symptoms.  He never follow-up with me.  More recently went to GI have his colonoscopy and some recurrent symptoms.  He reports some intermittent rectal pain that is moderate in intensity and some occasional hematochezia.  No fevers or chills.  He does have some reflux symptoms but they are well controlled with medication and he is currently not interested in pursuing any additional testing or surgical intervention.  He did  have a CT scan that I personally reviewed 2 years ago that any major abnormalities.  Past Medical History:  Diagnosis Date   Dyslipidemia    GERD (gastroesophageal reflux disease)    Heart murmur    Hypertension    Motion sickness    amusement park rides   Trigeminal autonomic cephalgias     Past Surgical History:  Procedure Laterality Date   APPENDECTOMY     broken leg  1999   Left, has a rod and a screw from ankle to knee   CERVICAL DISCECTOMY  2008   c-5   COLONOSCOPY  2014   Dr Charles Shepard   COLONOSCOPY WITH PROPOFOL N/A 03/04/2020   Procedure: COLONOSCOPY WITH PROPOFOL;  Surgeon: Charles Minium, MD;  Location: [facility];  Service: Endoscopy;  Laterality: N/A;  priority 4    Family History  Problem Relation Age of Onset   Hyperlipidemia Mother    Diabetes Paternal Aunt    Colon cancer Neg Hx     Social History:  reports that he quit smoking about 13 years ago. His smoking use included cigarettes. He has a 5.00 pack-year smoking history. He has quit using smokeless tobacco. He reports current alcohol use of about 16.0 standard drinks of alcohol per week. He reports that he does not use drugs.  Allergies: No Known Allergies  Medications  reviewed.    ROS Full ROS performed and is otherwise negative other than what is stated in HPI   BP 112/78   Pulse 97   Temp 98.3 F (36.8 C) (Oral)   Ht 5\' 11"  (1.803 m)   Wt 190 lb 3.2 oz (86.3 kg)   SpO2 94%   BMI 26.53 kg/m   Physical Exam Vitals and nursing note reviewed. Exam conducted with a chaperone present.  Constitutional:      General: He is not in acute distress.    Appearance: Normal appearance. He is normal weight.  Neck:     Comments: Left lower neck incision from anterior fusion Cardiovascular:     Rate and Rhythm: Normal rate and regular rhythm.     Heart sounds: No murmur heard. Pulmonary:     Effort: Pulmonary effort is normal. No respiratory distress.     Breath sounds: No stridor. No wheezing or rhonchi.  Abdominal:     General: Abdomen is flat. There is no distension.     Palpations: Abdomen is soft. There is no mass.     Tenderness: There is no abdominal tenderness. There is no guarding.     Hernia: No hernia is present.  Genitourinary:    Comments: Posterior midline anal fissure tender to palpation exam is limited due to pain.  No  masses Musculoskeletal:        General: No swelling or tenderness. Normal range of motion.     Cervical back: Normal range of motion and neck supple. No rigidity or tenderness.  Skin:    General: Skin is warm.     Capillary Refill: Capillary refill takes less than 2 seconds.     Coloration: Skin is not jaundiced.  Neurological:     General: No focal deficit present.     Mental Status: He is alert and oriented to person, place, and time.  Psychiatric:        Mood and Affect: Mood normal.        Thought Content: Thought content normal.        Judgment: Judgment normal.      Assessment/Plan: Recurrent anal fissure.  Discussed with patient in detail her options I will go and prescribe in her refill for nifedipine cream.  We also discussed about neck steps and I do think that chemical sphincterotomy with Botox is  indicated.  Procedure discussed with patient in detail.  Risks, benefits and possible medications including but not limited to: Bleeding, infection chronic pain, incontinence.  He understands and wished to proceed  Greater than 50% of the 45 minutes  visit was spent in counseling/coordination of care   Charles Big, MD [facility] Surgeon

## 2021-05-05 NOTE — Patient Instructions (Addendum)
Our surgery scheduler Britta Mccreedy will call you within 24-48 hours to get you scheduled. If you have not heard from her after 48 hours, please call our office. You will not need to get Covid tested before surgery and have the blue sheet available when she calls to write down important information.   Pick up your prescription at [facility] in [place].   If you have any concerns or questions, please feel free to call our office.                                                                                                                                                                                                                                               Anal Fissure, Adult  An anal fissure is a small tear or crack in the tissue around the opening of the butt (anus). Bleeding from the tear or crack usually stops on its own within a few minutes. The bleeding may happen every time you poop (have a bowel movement) until the tear or crack heals. What are the causes? This condition is usually caused by passing a large or hard poop (stool). Other causes include: Trouble pooping (constipation). Passing watery poop (diarrhea). Inflammatory bowel disease (Crohn's disease or ulcerative colitis). Childbirth. Infections. Anal sex. What are the signs or symptoms? Symptoms of this condition include: Bleeding from the butt. Small amounts of blood on your poop. The blood coats the outside of the poop. It is not mixed with the poop. Small amounts of blood on the toilet paper or in the toilet after you poop. Pain when passing poop. Itching or irritation around the opening of the butt. How is this diagnosed? This condition may be diagnosed based on a physical exam. Your doctor may: Check your butt. A tear can often be seen by checking the area with care. Check your butt using a short tube (anoscope). The light in the tube will show any problems in your butt. How is this treated? Treatment  for this condition may include: Treating problems that make it hard for you to pass poop. You may be told to: Eat more fiber. Drink more fluid. Take fiber supplements. Take medicines that make poop soft. Taking sitz baths. This may help to heal the tear. Using creams and ointments. If your condition gets worse, other treatments may be needed such as: A shot  near the tear or crack (botulinum injection). Surgery to repair the tear or crack. Follow these instructions at home: Eating and drinking  Avoid bananas and dairy products. These foods can make it hard to poop. Drink enough fluid to keep your pee (urine) pale yellow. Eat foods that have a lot of fiber in them, such as: Beans. Whole grains. Fresh fruits. Fresh vegetables.  General instructions  Take over-the-counter and prescription medicines only as told by your doctor. Use creams or ointments only as told by your doctor. Keep the butt area as clean and dry as you can. Take a warm water bath (sitz bath) as told by your doctor. Do not use soap. Keep all follow-up visits as told by your doctor. This is important.  Contact a doctor if: You have more bleeding. You have a fever. You have watery poop that is mixed with blood. You have pain. Your problem gets worse, not better. Summary An anal fissure is a small tear or crack in the skin around the opening of the butt (anus). This condition is usually caused by passing a large or hard poop (stool). Treatment includes treating the problems that make it hard for you to pass poop. Follow your doctor's instructions about caring for your condition at home. Keep all follow-up visits as told by your doctor. This is important. This information is not intended to replace advice given to you by your health care provider. Make sure you discuss any questions you have with your healthcare provider. Document Revised: 04/07/2018 Document Reviewed: 04/07/2018 Elsevier Patient Education  2022  [employer].

## 2021-05-05 NOTE — Progress Notes (Signed)
Outpatient Surgical Follow Up  05/05/2021  Charles Shepard is an 59 y.o. male.   Chief Complaint  Patient presents with   New Patient (Initial Visit)    Anal fissure    HPI: History is a 59-year-old male that is 17 years ago for an anal fissure and prescribed nifedipine cream.  He continues to have some intermittent symptoms.  He never follow-up with me.  More recently went to GI have his colonoscopy and some recurrent symptoms.  He reports some intermittent rectal pain that is moderate in intensity and some occasional hematochezia.  No fevers or chills.  He does have some reflux symptoms but they are well controlled with medication and he is currently not interested in pursuing any additional testing or surgical intervention.  He did  have a CT scan that I personally reviewed 2 years ago that any major abnormalities.  Past Medical History:  Diagnosis Date   Dyslipidemia    GERD (gastroesophageal reflux disease)    Heart murmur    Hypertension    Motion sickness    amusement park rides   Trigeminal autonomic cephalgias     Past Surgical History:  Procedure Laterality Date   APPENDECTOMY     broken leg  1999   Left, has a rod and a screw from ankle to knee   CERVICAL DISCECTOMY  2008   c-5   COLONOSCOPY  2014   Dr Wohl   COLONOSCOPY WITH PROPOFOL N/A 03/04/2020   Procedure: COLONOSCOPY WITH PROPOFOL;  Surgeon: Wohl, Darren, MD;  Location: [facility];  Service: Endoscopy;  Laterality: N/A;  priority 4    Family History  Problem Relation Age of Onset   Hyperlipidemia Mother    Diabetes Paternal Aunt    Colon cancer Neg Hx     Social History:  reports that he quit smoking about 13 years ago. His smoking use included cigarettes. He has a 5.00 pack-year smoking history. He has quit using smokeless tobacco. He reports current alcohol use of about 16.0 standard drinks of alcohol per week. He reports that he does not use drugs.  Allergies: No Known Allergies  Medications  reviewed.    ROS Full ROS performed and is otherwise negative other than what is stated in HPI   BP 112/78   Pulse 97   Temp 98.3 F (36.8 C) (Oral)   Ht 5' 11" (1.803 m)   Wt 190 lb 3.2 oz (86.3 kg)   SpO2 94%   BMI 26.53 kg/m   Physical Exam Vitals and nursing note reviewed. Exam conducted with a chaperone present.  Constitutional:      General: He is not in acute distress.    Appearance: Normal appearance. He is normal weight.  Neck:     Comments: Left lower neck incision from anterior fusion Cardiovascular:     Rate and Rhythm: Normal rate and regular rhythm.     Heart sounds: No murmur heard. Pulmonary:     Effort: Pulmonary effort is normal. No respiratory distress.     Breath sounds: No stridor. No wheezing or rhonchi.  Abdominal:     General: Abdomen is flat. There is no distension.     Palpations: Abdomen is soft. There is no mass.     Tenderness: There is no abdominal tenderness. There is no guarding.     Hernia: No hernia is present.  Genitourinary:    Comments: Posterior midline anal fissure tender to palpation exam is limited due to pain.  No   masses Musculoskeletal:        General: No swelling or tenderness. Normal range of motion.     Cervical back: Normal range of motion and neck supple. No rigidity or tenderness.  Skin:    General: Skin is warm.     Capillary Refill: Capillary refill takes less than 2 seconds.     Coloration: Skin is not jaundiced.  Neurological:     General: No focal deficit present.     Mental Status: He is alert and oriented to person, place, and time.  Psychiatric:        Mood and Affect: Mood normal.        Thought Content: Thought content normal.        Judgment: Judgment normal.      Assessment/Plan: Recurrent anal fissure.  Discussed with patient in detail her options I will go and prescribe in her refill for nifedipine cream.  We also discussed about neck steps and I do think that chemical sphincterotomy with Botox is  indicated.  Procedure discussed with patient in detail.  Risks, benefits and possible medications including but not limited to: Bleeding, infection chronic pain, incontinence.  He understands and wished to proceed  Greater than 50% of the 45 minutes  visit was spent in counseling/coordination of care   Sterling Big, MD [facility] Sur[facility]

## 2021-05-05 NOTE — Telephone Encounter (Signed)
Patient has been advised of Pre-Admission date/time, COVID Testing date and Surgery date.  Surgery Date: 05/15/21 Preadmission Testing Date: 05/07/21 (phone 1p-5p) Covid Testing Date: Not needed.    Patient has been made aware to call [phone], between 1-3:00pm the day before surgery, to find out what time to arrive for surgery.

## 2021-05-07 ENCOUNTER — Encounter
Admission: RE | Admit: 2021-05-07 | Discharge: 2021-05-07 | Disposition: A | Discharge status: Home / Self Care | Payer: 59 | Source: Ambulatory Visit | Attending: Surgery | Admitting: Surgery

## 2021-05-07 NOTE — Patient Instructions (Addendum)
Your procedure is scheduled on:05-15-21 Thursday Report to the Registration Desk on the 1st floor of the [facility]-Then proceed to the 2nd floor Surgery Desk in the Medical [facility] To find out your arrival time, please call [phone] between 1PM - 3PM on:05-14-21 Wednesday  REMEMBER: Instructions that are not followed completely may result in serious medical risk, up to and including death; or upon the discretion of your surgeon and anesthesiologist your surgery may need to be rescheduled.  Do not eat food after midnight the night before surgery.  No gum chewing, lozengers or hard candies.  You may however, drink CLEAR liquids up to 2 hours before you are scheduled to arrive for your surgery. Do not drink anything within 2 hours of your scheduled arrival time.  Clear liquids include: - water  - apple juice without pulp - gatorade  - black coffee or tea (Do NOT add milk or creamers to the coffee or tea) Do NOT drink anything that is not on this list.  TAKE THESE MEDICATIONS THE MORNING OF SURGERY WITH A SIP OF WATER: -Norvasc (Amlodipine) -Crestor (Rosuvastatin) -Dexilant (Dexlansoprazole)-take one the night before and one on the morning of surgery - helps to prevent nausea after surgery.)  Stop your 81 mg Aspirin 7 days prior to surgery  One week prior to surgery: Stop Anti-inflammatories (NSAIDS) such as Advil, Aleve, Ibuprofen, Motrin, Naproxen, Naprosyn and Aspirin based products such as Excedrin, Goodys Powder, BC Powder.You may however, continue to take Tylenol if needed for pain up until the day of surgery.  Stop ANY OVER THE COUNTER supplements/vitamins 7 days prior to surgery  No Alcohol for 24 hours before or after surgery.  No Smoking including e-cigarettes for 24 hours prior to surgery.  No chewable tobacco products for at least 6 hours prior to surgery.  No nicotine patches on the day of surgery.  Do not use any "recreational" drugs for at least a week prior to your  surgery.  Please be advised that the combination of cocaine and anesthesia may have negative outcomes, up to and including death. If you test positive for cocaine, your surgery will be cancelled.  On the morning of surgery brush your teeth with toothpaste and water, you may rinse your mouth with mouthwash if you wish. Do not swallow any toothpaste or mouthwash.  Do not wear jewelry, make-up, hairpins, clips or nail polish.  Do not wear lotions, powders, or perfumes.   Do not shave body from the neck down 48 hours prior to surgery just in case you cut yourself which could leave a site for infection.  Also, freshly shaved skin may become irritated if using the CHG soap.  Contact lenses, hearing aids and dentures may not be worn into surgery.  Do not bring valuables to the hospital. [facility] is not responsible for any missing/lost belongings or valuables.   Fleets enema as directed x2-Do Fleet Enema at home the night before your surgery and another Fleet Enema the morning of surgery 1 hour prior to arrival time to the hosptial  Notify your doctor if there is any change in your medical condition (cold, fever, infection).  Wear comfortable clothing (specific to your surgery type) to the hospital.  After surgery, you can help prevent lung complications by doing breathing exercises.  Take deep breaths and cough every 1-2 hours. Your doctor may order a device called an Incentive Spirometer to help you take deep breaths. When coughing or sneezing, hold a pillow firmly against your incision  with both hands. This is called "splinting." Doing this helps protect your incision. It also decreases belly discomfort.  If you are being admitted to the hospital overnight, leave your suitcase in the car. After surgery it may be brought to your room.  If you are being discharged the day of surgery, you will not be allowed to drive home. You will need a responsible adult (18 years or older) to drive you  home and stay with you that night.   If you are taking public transportation, you will need to have a responsible adult (18 years or older) with you. Please confirm with your physician that it is acceptable to use public transportation.   Please call the Pre-admissions Testing Dept. at [phone] if you have any questions about these instructions.  Surgery Visitation Policy:  Patients undergoing a surgery or procedure may have one family member or support person with them as long as that person is not COVID-19 positive or experiencing its symptoms.  That person may remain in the waiting area during the procedure.  Inpatient Visitation:    Visiting hours are 7 a.m. to 8 p.m. Inpatients will be allowed two visitors daily. The visitors may change each day during the patient's stay. No visitors under the age of 25. Any visitor under the age of 35 must be accompanied by an adult. The visitor must pass COVID-19 screenings, use hand sanitizer when entering and exiting the patient's room and wear a mask at all times, including in the patient's room. Patients must also wear a mask when staff or their visitor are in the room. Masking is required regardless of vaccination status.

## 2021-05-08 ENCOUNTER — Inpatient Hospital Stay: Admission: RE | Admit: 2021-05-08 | Payer: 59 | Source: Ambulatory Visit

## 2021-05-13 ENCOUNTER — Telehealth: Payer: Self-pay

## 2021-05-13 NOTE — Telephone Encounter (Signed)
Ok thanks for speaking with him.

## 2021-05-13 NOTE — Telephone Encounter (Signed)
Patient is requesting his surgery be done by using the infrared light approach- instead of botox-He would like to discuss this matter further with you-he is currently scheduled 06/03/21 EUA. Please call him today if possible

## 2021-05-21 HISTORY — PX: CARPAL TUNNEL RELEASE: SHX101

## 2021-05-26 ENCOUNTER — Telehealth: Payer: Self-pay | Admitting: Surgery

## 2021-05-26 ENCOUNTER — Other Ambulatory Visit: Payer: Self-pay

## 2021-05-26 ENCOUNTER — Other Ambulatory Visit
Admission: RE | Admit: 2021-05-26 | Discharge: 2021-05-26 | Disposition: A | Discharge status: Home / Self Care | Payer: 59 | Source: Ambulatory Visit | Attending: Surgery | Admitting: Surgery

## 2021-05-26 NOTE — Telephone Encounter (Signed)
Updated information regarding rescheduled surgery.  At patient's request surgery rescheduled to 06/03/21.  Patient to let us know if he still wants to continue with surgery as there is an issue where the Botox was approved, but they do not allow a buy and bill with [facility].   Pt has been advised of Pre-Admission date/time, COVID Testing date and Surgery date.  Surgery Date: 06/03/21 Preadmission Testing Date: 05/26/21 (phone 1p-5p) Covid Testing Date: Not needed.    Patient has been made aware to call [phone], between 1-3:00pm the day before surgery, to find out what time to arrive for surgery.

## 2021-05-26 NOTE — Patient Instructions (Addendum)
Your procedure is scheduled on: June 03, 2021  Tuesday  Report to the Registration Desk on the 1st floor of the [facility] To find out your arrival time, please call [phone] between 1PM - 3PM on: Monday June 02, 2021  REMEMBER: Instructions that are not followed completely may result in serious medical risk, up to and including death; or upon the discretion of your surgeon and anesthesiologist your surgery may need to be rescheduled.  Do not eat food after midnight the night before surgery.  No gum chewing, lozengers or hard candies.  You may however, drink CLEAR liquids up to 2 hours before you are scheduled to arrive for your surgery. Do not drink anything within 2 hours of your scheduled arrival time.  Clear liquids include: - water  - apple juice without pulp - gatorade (not RED, PURPLE, OR BLUE) - black coffee or tea (Do NOT add milk or creamers to the coffee or tea) Do NOT drink anything that is not on this list.  Type 1 and Type 2 diabetics should only drink water.  TAKE THESE MEDICATIONS THE MORNING OF SURGERY WITH A SIP OF WATER: NONE  One week prior to surgery: Stop Anti-inflammatories (NSAIDS) such as Advil, Aleve, Ibuprofen, Motrin, Naproxen, Naprosyn and ASPIRIN/Aspirin based products such as Excedrin, Goodys Powder, BC Powder. Stop ANY OVER THE COUNTER supplements until after surgery. You may however, continue to take Tylenol if needed for pain up until the day of surgery.  No Alcohol for 24 hours before or after surgery.  No Smoking including e-cigarettes for 24 hours prior to surgery.  No chewable tobacco products for at least 6 hours prior to surgery.  No nicotine patches on the day of surgery.  Do not use any "recreational" drugs for at least a week prior to your surgery.  Please be advised that the combination of cocaine and anesthesia may have negative outcomes, up to and including death. If you test positive for cocaine, your surgery will be  cancelled.  On the morning of surgery brush your teeth with toothpaste and water, you may rinse your mouth with mouthwash if you wish. Do not swallow any toothpaste or mouthwash.  Do not wear jewelry, make-up, hairpins, clips or nail polish.  Do not wear lotions, powders, or perfumes DEODORANT  Do not shave body from the neck down 48 hours prior to surgery just in case you cut yourself which could leave a site for infection.  Also, freshly shaved skin may become irritated if using the CHG soap.  Contact lenses, hearing aids and dentures may not be worn into surgery.  Do not bring valuables to the hospital. [facility] is not responsible for any missing/lost belongings or valuables.   Use CHG Soap as directed on instruction sheet.  Fleets enema as directed.  Notify your doctor if there is any change in your medical condition (cold, fever, infection).  Wear comfortable clothing (specific to your surgery type) to the hospital.  After surgery, you can help prevent lung complications by doing breathing exercises.  Take deep breaths and cough every 1-2 hours. Your doctor may order a device called an Incentive Spirometer to help you take deep breaths. When coughing or sneezing, hold a pillow firmly against your incision with both hands. This is called "splinting." Doing this helps protect your incision. It also decreases belly discomfort.  If you are being admitted to the hospital overnight, leave your suitcase in the car. After surgery it may be brought to your  room.  If you are being discharged the day of surgery, you will not be allowed to drive home. You will need a responsible adult (18 years or older) to drive you home and stay with you that night.   If you are taking public transportation, you will need to have a responsible adult (18 years or older) with you. Please confirm with your physician that it is acceptable to use public transportation.   Please call the Pre-admissions  Testing Dept. at [phone] if you have any questions about these instructions.  Surgery Visitation Policy:  Patients undergoing a surgery or procedure may have one family member or support person with them as long as that person is not COVID-19 positive or experiencing its symptoms.  That person may remain in the waiting area during the procedure.  Inpatient Visitation:    Visiting hours are 7 a.m. to 8 p.m. Inpatients will be allowed two visitors daily. The visitors may change each day during the patient's stay. No visitors under the age of 58. Any visitor under the age of 104 must be accompanied by an adult. The visitor must pass COVID-19 screenings, use hand sanitizer when entering and exiting the patient's room and wear a mask at all times, including in the patient's room. Patients must also wear a mask when staff or their visitor are in the room. Masking is required regardless of vaccination status.

## 2021-05-27 ENCOUNTER — Other Ambulatory Visit: Payer: 59

## 2021-05-29 ENCOUNTER — Encounter: Payer: Self-pay | Admitting: Urgent Care

## 2021-05-29 ENCOUNTER — Encounter
Admission: RE | Admit: 2021-05-29 | Discharge: 2021-05-29 | Disposition: A | Discharge status: Home / Self Care | Payer: 59 | Source: Ambulatory Visit | Attending: Surgery | Admitting: Surgery

## 2021-05-29 ENCOUNTER — Other Ambulatory Visit: Payer: Self-pay

## 2021-05-29 DIAGNOSIS — I10 Essential (primary) hypertension: Secondary | ICD-10-CM | POA: Insufficient documentation

## 2021-05-29 DIAGNOSIS — Z01810 Encounter for preprocedural cardiovascular examination: Secondary | ICD-10-CM | POA: Insufficient documentation

## 2021-06-03 ENCOUNTER — Encounter
Admission: RE | Disposition: A | Discharge status: Home / Self Care | Payer: Self-pay | Source: Ambulatory Visit | Attending: Surgery

## 2021-06-03 ENCOUNTER — Ambulatory Visit: Payer: Self-pay | Admitting: Urgent Care

## 2021-06-03 ENCOUNTER — Other Ambulatory Visit: Payer: Self-pay

## 2021-06-03 ENCOUNTER — Ambulatory Visit
Admission: RE | Admit: 2021-06-03 | Discharge: 2021-06-03 | Disposition: A | Discharge status: Home / Self Care | Payer: 59 | Source: Ambulatory Visit | Attending: Surgery | Admitting: Surgery

## 2021-06-03 ENCOUNTER — Encounter: Payer: Self-pay | Admitting: Urgent Care

## 2021-06-03 ENCOUNTER — Encounter: Payer: Self-pay | Admitting: Surgery

## 2021-06-03 DIAGNOSIS — K602 Anal fissure, unspecified: Secondary | ICD-10-CM | POA: Insufficient documentation

## 2021-06-03 DIAGNOSIS — Z87891 Personal history of nicotine dependence: Secondary | ICD-10-CM | POA: Insufficient documentation

## 2021-06-03 HISTORY — PX: SPHINCTEROTOMY: SHX5279

## 2021-06-03 HISTORY — PX: BOTOX INJECTION: SHX5754

## 2021-06-03 LAB — URINE DRUG SCREEN, QUALITATIVE (ARMC ONLY)
Amphetamines, Ur Screen: NOT DETECTED
Barbiturates, Ur Screen: NOT DETECTED
Benzodiazepine, Ur Scrn: NOT DETECTED
Cannabinoid 50 Ng, Ur ~~LOC~~: POSITIVE — AB
Cocaine Metabolite,Ur ~~LOC~~: NOT DETECTED
MDMA (Ecstasy)Ur Screen: NOT DETECTED
Methadone Scn, Ur: NOT DETECTED
Opiate, Ur Screen: NOT DETECTED
Phencyclidine (PCP) Ur S: NOT DETECTED
Tricyclic, Ur Screen: NOT DETECTED

## 2021-06-03 SURGERY — EXAM UNDER ANESTHESIA
Anesthesia: General

## 2021-06-03 MED ORDER — CHLORHEXIDINE GLUCONATE 0.12 % MT SOLN
OROMUCOSAL | Status: AC
  Administered 2021-06-03: 15 mL via OROMUCOSAL
  Filled 2021-06-03: qty 15

## 2021-06-03 MED ORDER — ORAL CARE MOUTH RINSE: 15.0000 mL | Freq: Once | OROMUCOSAL | Status: AC

## 2021-06-03 MED ORDER — MIDAZOLAM HCL 2 MG/2ML IJ SOLN
INTRAMUSCULAR | Status: DC | PRN
  Administered 2021-06-03: 2 mg via INTRAVENOUS

## 2021-06-03 MED ORDER — CHLORHEXIDINE GLUCONATE 0.12 % MT SOLN: 15.0000 mL | Freq: Once | OROMUCOSAL | Status: AC

## 2021-06-03 MED ORDER — FAMOTIDINE 20 MG PO TABS: 20.0000 mg | ORAL_TABLET | Freq: Once | ORAL | Status: AC

## 2021-06-03 MED ORDER — OXYCODONE HCL 5 MG/5ML PO SOLN: 5.0000 mg | Freq: Once | ORAL | Status: DC | PRN

## 2021-06-03 MED ORDER — LACTATED RINGERS IV SOLN: INTRAVENOUS | Status: DC

## 2021-06-03 MED ORDER — BUPIVACAINE LIPOSOME 1.3 % IJ SUSP
INTRAMUSCULAR | Status: DC | PRN
  Administered 2021-06-03: 20 mL

## 2021-06-03 MED ORDER — PROPOFOL 10 MG/ML IV BOLUS
INTRAVENOUS | Status: AC
  Filled 2021-06-03: qty 20

## 2021-06-03 MED ORDER — GLYCOPYRROLATE 0.2 MG/ML IJ SOLN
INTRAMUSCULAR | Status: DC | PRN
  Administered 2021-06-03: .2 mg via INTRAVENOUS

## 2021-06-03 MED ORDER — FENTANYL CITRATE (PF) 100 MCG/2ML IJ SOLN: 25.0000 ug | INTRAMUSCULAR | Status: DC | PRN

## 2021-06-03 MED ORDER — KETAMINE HCL 10 MG/ML IJ SOLN
INTRAMUSCULAR | Status: DC | PRN
  Administered 2021-06-03: 20 mg via INTRAVENOUS

## 2021-06-03 MED ORDER — OXYCODONE HCL 5 MG PO TABS: 5.0000 mg | ORAL_TABLET | Freq: Once | ORAL | Status: DC | PRN

## 2021-06-03 MED ORDER — FENTANYL CITRATE (PF) 100 MCG/2ML IJ SOLN
INTRAMUSCULAR | Status: DC | PRN
  Administered 2021-06-03 (×2): 50 ug via INTRAVENOUS

## 2021-06-03 MED ORDER — FENTANYL CITRATE (PF) 100 MCG/2ML IJ SOLN
INTRAMUSCULAR | Status: AC
  Filled 2021-06-03: qty 2

## 2021-06-03 MED ORDER — PROPOFOL 500 MG/50ML IV EMUL
INTRAVENOUS | Status: DC | PRN
  Administered 2021-06-03: 150 ug/kg/min via INTRAVENOUS

## 2021-06-03 MED ORDER — MIDAZOLAM HCL 2 MG/2ML IJ SOLN
INTRAMUSCULAR | Status: AC
  Filled 2021-06-03: qty 2

## 2021-06-03 MED ORDER — FAMOTIDINE 20 MG PO TABS
ORAL_TABLET | ORAL | Status: AC
  Administered 2021-06-03: 20 mg via ORAL
  Filled 2021-06-03: qty 1

## 2021-06-03 MED ORDER — ONABOTULINUMTOXINA 100 UNITS IJ SOLR
INTRAMUSCULAR | Status: DC | PRN
  Administered 2021-06-03: 50 [IU] via INTRAMUSCULAR

## 2021-06-03 MED ORDER — BUPIVACAINE LIPOSOME 1.3 % IJ SUSP
INTRAMUSCULAR | Status: AC
  Filled 2021-06-03: qty 20

## 2021-06-03 SURGICAL SUPPLY — 29 items
BLADE SURG 15 STRL LF DISP TIS (BLADE) ×1 IMPLANT
BLADE SURG 15 STRL SS (BLADE) ×2
BRIEF STRETCH FOR OB PAD XXL (UNDERPADS AND DIAPERS) ×2 IMPLANT
CANISTER SUCT 1200ML W/VALVE (MISCELLANEOUS) ×2 IMPLANT
DRAPE 3/4 80X56 (DRAPES) ×2 IMPLANT
DRAPE LEGGINS SURG 28X43 STRL (DRAPES) ×2 IMPLANT
DRAPE UNDER BUTTOCK W/FLU (DRAPES) ×2 IMPLANT
ELECT CAUTERY BLADE 6.4 (BLADE) ×2 IMPLANT
ELECT REM PT RETURN 9FT ADLT (ELECTROSURGICAL) ×2
ELECTRODE REM PT RTRN 9FT ADLT (ELECTROSURGICAL) ×1 IMPLANT
GAUZE 4X4 16PLY ~~LOC~~+RFID DBL (SPONGE) ×4 IMPLANT
GLOVE SURG ENC MOIS LTX SZ7 (GLOVE) ×4 IMPLANT
GOWN STRL REUS W/ TWL LRG LVL3 (GOWN DISPOSABLE) ×2 IMPLANT
GOWN STRL REUS W/TWL LRG LVL3 (GOWN DISPOSABLE) ×6
KIT TURNOVER CYSTO (KITS) ×2 IMPLANT
MANIFOLD NEPTUNE II (INSTRUMENTS) ×2 IMPLANT
NDL HPO THNWL 1X22GA REG BVL (NEEDLE) ×2 IMPLANT
NEEDLE HYPO 22GX1.5 SAFETY (NEEDLE) ×2 IMPLANT
NEEDLE SAFETY 22GX1 (NEEDLE) ×4
NS IRRIG 1000ML POUR BTL (IV SOLUTION) ×2 IMPLANT
PACK BASIN MINOR ARMC (MISCELLANEOUS) ×2 IMPLANT
PAD OB MATERNITY 4.3X12.25 (PERSONAL CARE ITEMS) ×2 IMPLANT
PAD PREP 24X41 OB/GYN DISP (PERSONAL CARE ITEMS) ×2 IMPLANT
SOL PREP PVP 2OZ (MISCELLANEOUS) ×2
SOLUTION PREP PVP 2OZ (MISCELLANEOUS) ×1 IMPLANT
SPONGE T-LAP 18X18 ~~LOC~~+RFID (SPONGE) ×2 IMPLANT
SURGILUBE 2OZ TUBE FLIPTOP (MISCELLANEOUS) ×2 IMPLANT
SYR 10ML LL (SYRINGE) ×2 IMPLANT
SYR 20ML LL LF (SYRINGE) ×2 IMPLANT

## 2021-06-03 NOTE — Anesthesia Preprocedure Evaluation (Addendum)
Anesthesia Evaluation  Patient identified by MRN, date of birth, ID band Patient awake    Reviewed: Allergy & Precautions, NPO status , Patient's Chart, lab work & pertinent test results  Airway Mallampati: III  TM Distance: >3 FB Neck ROM: full    Dental no notable dental hx.    Pulmonary neg pulmonary ROS, Patient abstained from smoking., former smoker,    Pulmonary exam normal        Cardiovascular hypertension, + angina with exertion Normal cardiovascular exam+ Valvular Problems/Murmurs      Neuro/Psych negative neurological ROS  negative psych ROS   GI/Hepatic Neg liver ROS, GERD  Medicated,  Endo/Other  negative endocrine ROS  Renal/GU      Musculoskeletal   Abdominal   Peds  Hematology negative hematology ROS (+)   Anesthesia Other Findings Past Medical History: No date: Dyslipidemia No date: GERD (gastroesophageal reflux disease) No date: Heart murmur No date: Hypertension No date: Motion sickness     Comment:  amusement park rides 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  Past Surgical History: No date: APPENDECTOMY 1999: broken leg     Comment:  Left, has a rod and a screw from ankle to knee 05/21/2021: CARPAL TUNNEL RELEASE 2008: CERVICAL DISCECTOMY     Comment:  c-5 2014: COLONOSCOPY     Comment:  Dr Servando Snare 03/04/2020: COLONOSCOPY WITH PROPOFOL; N/A     Comment:  Procedure: COLONOSCOPY WITH PROPOFOL;  Surgeon: Midge Minium, MD;  Location: [facility];  Service:               Endoscopy;  Laterality: N/A;  priority 4  BMI    Body Mass Index: 26.50 kg/m      Reproductive/Obstetrics negative OB ROS                            Anesthesia Physical Anesthesia Plan  ASA: 2  Anesthesia Plan: General ETT   Post-op Pain Management:    Induction: Intravenous  PONV Risk Score and Plan: Ondansetron, Dexamethasone, Midazolam and Treatment may vary  due to age or medical condition  Airway Management Planned: Oral ETT  Additional Equipment:   Intra-op Plan:   Post-operative Plan: Extubation in OR  Informed Consent: I have reviewed the patients History and Physical, chart, labs and discussed the procedure including the risks, benefits and alternatives for the proposed anesthesia with the patient or authorized representative who has indicated his/her understanding and acceptance.     Dental Advisory Given  Plan Discussed with: Anesthesiologist, CRNA and Surgeon  Anesthesia Plan Comments: (Patient consented for risks of anesthesia including but not limited to:  - adverse reactions to medications - damage to eyes, teeth, lips or other oral mucosa - nerve damage due to positioning  - sore throat or hoarseness - Damage to heart, brain, nerves, lungs, other parts of body or loss of life  Patient voiced understanding.)        Anesthesia Quick Evaluation

## 2021-06-03 NOTE — Discharge Instructions (Signed)

## 2021-06-03 NOTE — Interval H&P Note (Signed)
History and Physical Interval Note:  06/03/2021 12:37 PM  Charles Shepard  has presented today for surgery, with the diagnosis of anal fissure.  The various methods of treatment have been discussed with the patient and family. After consideration of risks, benefits and other options for treatment, the patient has consented to  Procedure(s): EXAM UNDER ANESTHESIA (N/A) SPHINCTEROTOMY, chemical (N/A) BOTOX INJECTION (N/A) as a surgical intervention.  The patient's history has been reviewed, patient examined, no change in status, stable for surgery.  I have reviewed the patient's chart and labs.  Questions were answered to the patient's satisfaction.     Charles Shepard

## 2021-06-03 NOTE — Op Note (Signed)
  PRE-OPERATIVE DIAGNOSIS:  Anal fissure  POST-OPERATIVE DIAGNOSIS:  Anal fissure  PROCEDURE:   1. Anorectal Exam under Anesthesia 2. Chemical Lateral internal Sphincterotomy using 50 IU Botox  SURGEON:  Surgeon(s) and Role:    * Jozey Janco F, MD - Primary  FINDINGS: posterior midline anal fissure  EBL: minimal  ANESTHESIA: General    DICTATION:  Patient was explained about the  Procedure in detail. Risks, benefits and possible complications ( including but not limited to recurrence, transient incontinence, pain, bleeding)  and a consent was obtained. The patient taken to the operating room and placed in the lithotomy position.   Exam under anesthesia using the anal speculum revealed a posterior midline fissure.    No other intraluminal lesions were observed. Digital palpation was performed identifying the Internal Sphincter muscle and on the lateral aspect and injected 50 international units of Botox in the standard fashion. Liposomal Marcaine  was injected around the perianal site. Needle and laparotomy counts were correct and there were no immediate complications  Leafy Ro, MD, FACS

## 2021-06-03 NOTE — Transfer of Care (Signed)
Immediate Anesthesia Transfer of Care Note  Patient: Charles Shepard  Procedure(s) Performed: EXAM UNDER ANESTHESIA SPHINCTEROTOMY, chemical BOTOX INJECTION  Patient Location: PACU  Anesthesia Type:General  Level of Consciousness: awake  Airway & Oxygen Therapy: Patient Spontanous Breathing  Post-op Assessment: Report given to RN and Post -op Vital signs reviewed and stable  Post vital signs: Reviewed and stable  Last Vitals:  Vitals Value Taken Time  BP 103/86 06/03/21 1327  Temp    Pulse 65 06/03/21 1328  Resp 19 06/03/21 1328  SpO2 96 % 06/03/21 1328  Vitals shown include unvalidated device data.  Last Pain:  Vitals:   06/03/21 1216  TempSrc: Temporal  PainSc: 0-No pain         Complications: No notable events documented.

## 2021-06-03 NOTE — Anesthesia Postprocedure Evaluation (Signed)
Anesthesia Post Note  Patient: Hanif L Jorstad  Procedure(s) Performed: EXAM UNDER ANESTHESIA SPHINCTEROTOMY, chemical BOTOX INJECTION  Patient location during evaluation: PACU Anesthesia Type: General Level of consciousness: awake and alert Pain management: pain level controlled Vital Signs Assessment: post-procedure vital signs reviewed and stable Respiratory status: spontaneous breathing, nonlabored ventilation, respiratory function stable and patient connected to nasal cannula oxygen Cardiovascular status: stable and blood pressure returned to baseline Postop Assessment: no apparent nausea or vomiting Anesthetic complications: no   No notable events documented.   Last Vitals:  Vitals:   06/03/21 1216 06/03/21 1330  BP: 137/82 103/76  Pulse: 70 74  Resp: 16 15  Temp: 36.8 C 36.7 C  SpO2: 97% 95%    Last Pain:  Vitals:   06/03/21 1330  TempSrc:   PainSc: 0-No pain                 Johny Blamer

## 2021-06-04 ENCOUNTER — Encounter: Payer: Self-pay | Admitting: Surgery

## 2021-06-11 ENCOUNTER — Ambulatory Visit (INDEPENDENT_AMBULATORY_CARE_PROVIDER_SITE_OTHER): Payer: 59 | Admitting: Surgery

## 2021-06-11 ENCOUNTER — Encounter: Payer: Self-pay | Admitting: Surgery

## 2021-06-11 ENCOUNTER — Other Ambulatory Visit: Payer: Self-pay

## 2021-06-11 VITALS — BP 117/76 | HR 63 | Temp 98.1°F | Ht 71.0 in | Wt 185.8 lb

## 2021-06-11 DIAGNOSIS — K602 Anal fissure, unspecified: Secondary | ICD-10-CM

## 2021-06-11 NOTE — Patient Instructions (Addendum)
Continue to use the Nifedipine cream every day. Do sitz baths several times daily.

## 2021-06-12 NOTE — Progress Notes (Signed)
Outpatient Surgical Follow Up  06/12/2021  Charles Shepard is an 60 y.o. male.   Chief Complaint  Patient presents with   Routine Post Op    HPI: Charles Shepard is following up after chemical sphincterotomy with Botox for fissure.  He is doing well but had some symptoms including itchiness and mild pain.  He stated after the injection he had no symptoms for about 3 days and then recur.  He has not done any sitz bath's and has not been on any nifedipine cream.  Discussed with him in detail about the importance of continuation with those measures.  Showed understanding Past Medical History:  Diagnosis Date   Dyslipidemia    GERD (gastroesophageal reflux disease)    Heart murmur    Hypertension    Motion sickness    amusement park rides   Trigeminal autonomic cephalgias     Past Surgical History:  Procedure Laterality Date   APPENDECTOMY     BOTOX INJECTION N/A 06/03/2021   Procedure: BOTOX INJECTION;  Surgeon: Leafy Ro, MD;  Location: [facility];  Service: General;  Laterality: N/A;   broken leg  1999   Left, has a rod and a screw from ankle to knee   CARPAL TUNNEL RELEASE  05/21/2021   CERVICAL DISCECTOMY  2008   c-5   COLONOSCOPY  2014   Dr Servando Snare   COLONOSCOPY WITH PROPOFOL N/A 03/04/2020   Procedure: COLONOSCOPY WITH PROPOFOL;  Surgeon: Midge Minium, MD;  Location: [facility];  Service: Endoscopy;  Laterality: N/A;  priority 4   SPHINCTEROTOMY N/A 06/03/2021   Procedure: SPHINCTEROTOMY, chemical;  Surgeon: Leafy Ro, MD;  Location: [facility];  Service: General;  Laterality: N/A;    Family History  Problem Relation Age of Onset   Hyperlipidemia Mother    Diabetes Paternal Aunt    Colon cancer Neg Hx     Social History:  reports that he quit smoking about 13 years ago. His smoking use included cigarettes. He has a 5.00 pack-year smoking history. He has quit using smokeless tobacco. He reports current alcohol use of about 12.0 standard drinks of alcohol per week. He  reports current drug use. Drug: Marijuana.  Allergies: No Known Allergies  Medications reviewed.    ROS Full ROS performed and is otherwise negative other than what is stated in HPI   BP 117/76   Pulse 63   Temp 98.1 F (36.7 C)   Ht 5\' 11"  (1.803 m)   Wt 185 lb 12.8 oz (84.3 kg)   SpO2 97%   BMI 25.91 kg/m   Physical Exam NAD alert Abd: soft, nt Rectal: No evidence of perineal infection.  There is evidence of a posterior midline fissure.  No masses no block  Assessment/Plan: Anal fissure.  Status post Botox.  Discussed again with him about the need for twice daily nifedipine cream as well as sitz bath's.  Currently patient forgot or was unaware that he needed to continue treatment. Seen back in about 3 weeks.  There is no evidence of complications.  Greater than 50% of the 20 minutes  visit was spent in counseling/coordination of care   , MD [facility] General Surgeon

## 2021-06-25 ENCOUNTER — Encounter: Payer: 59 | Admitting: Surgery

## 2021-06-25 ENCOUNTER — Telehealth: Payer: Self-pay

## 2021-06-25 ENCOUNTER — Ambulatory Visit (INDEPENDENT_AMBULATORY_CARE_PROVIDER_SITE_OTHER): Payer: 59 | Admitting: Surgery

## 2021-06-25 ENCOUNTER — Encounter: Payer: Self-pay | Admitting: Surgery

## 2021-06-25 ENCOUNTER — Other Ambulatory Visit: Payer: Self-pay

## 2021-06-25 VITALS — BP 122/81 | HR 60 | Temp 98.3°F | Ht 70.0 in | Wt 182.8 lb

## 2021-06-25 DIAGNOSIS — I10 Essential (primary) hypertension: Secondary | ICD-10-CM

## 2021-06-25 DIAGNOSIS — R1084 Generalized abdominal pain: Secondary | ICD-10-CM

## 2021-06-25 NOTE — Telephone Encounter (Signed)
Call to patient to give information for his CT scan. He is scheduled for a CT scan at the [facility], located at [address], [address], [place], [place] [address]. This is for Tuesday August 30th, arrival time is 8:45 am.  He will need to pick up a prep kit from them a few days before his scan and follow the instructions. He is to have nothing else to eat or drink for 4 hours prior to the scan.  Patient aware of instructions and time.     

## 2021-06-25 NOTE — Patient Instructions (Addendum)
Follow up here in 2 weeks. We will call you about the CT scan.   You need to keep your bowel movements soft and regular. Also use the Nifedipine cream every day.   You may use Benefiber powder 1-2 times a day to help with this.  You may use the generic power from [employer] as it is cheaper.     Continue to use the Nifedipine cream every day.  Follow up here in 2 weeks.

## 2021-06-27 NOTE — Progress Notes (Signed)
Outpatient Surgical Follow Up  06/27/2021  Zyon L Vangilder is an 60 y.o. male.   Chief Complaint  Patient presents with   Routine Post Op    EUA     HPI: Mr. 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  He initially did very well for the first 4 days and had some recurrence of symptoms.  Unfortunately patient has not been compliant with nifedipine cream.  He still complain of some itching and some pain but no bleeding.  He denies any fevers any chills.  He now complains of left lower quadrant pain that is intermittent sharp and moderate in intensity.  No specific alleviating or aggravating factors.  No nausea or vomiting. Past Medical History:  Diagnosis Date   Dyslipidemia    GERD (gastroesophageal reflux disease)    Heart murmur    Hypertension    Motion sickness    amusement park rides   Trigeminal autonomic cephalgias     Past Surgical History:  Procedure Laterality Date   APPENDECTOMY     BOTOX INJECTION N/A 06/03/2021   Procedure: BOTOX INJECTION;  Surgeon: Leafy Ro, MD;  Location: [facility];  Service: General;  Laterality: N/A;   broken leg  1999   Left, has a rod and a screw from ankle to knee   CARPAL TUNNEL RELEASE  05/21/2021   CERVICAL DISCECTOMY  2008   c-5   COLONOSCOPY  2014   Dr Servando Snare   COLONOSCOPY WITH PROPOFOL N/A 03/04/2020   Procedure: COLONOSCOPY WITH PROPOFOL;  Surgeon: Midge Minium, MD;  Location: [facility];  Service: Endoscopy;  Laterality: N/A;  priority 4   SPHINCTEROTOMY N/A 06/03/2021   Procedure: SPHINCTEROTOMY, chemical;  Surgeon: Leafy Ro, MD;  Location: [facility];  Service: General;  Laterality: N/A;    Family History  Problem Relation Age of Onset   Hyperlipidemia Mother    Diabetes Paternal Aunt    Colon cancer Neg Hx     Social History:  reports that he quit smoking about 13 years ago. His smoking use included cigarettes. He has a 5.00  pack-year smoking history. He has quit using smokeless tobacco. He reports current alcohol use of about 12.0 standard drinks per week. He reports current drug use. Drug: Marijuana.  Allergies: No Known Allergies  Medications reviewed.    ROS Full ROS performed and is otherwise negative other than what is stated in HPI   BP 122/81   Pulse 60   Temp 98.3 F (36.8 C) (Oral)   Ht 5\' 10"  (1.778 m)   Wt 182 lb 12.8 oz (82.9 kg)   SpO2 96%   BMI 26.23 kg/m   Physical Exam Vitals and nursing note reviewed. Exam conducted with a chaperone present.  Constitutional:      Appearance: Normal appearance. He is normal weight.  Pulmonary:     Effort: Pulmonary effort is normal. No respiratory distress.     Breath sounds: No stridor.  Abdominal:     General: Abdomen is flat. There is no distension.     Palpations: There is no mass.     Tenderness: There is abdominal tenderness. There is no guarding or rebound.     Hernia: No hernia is present.     Comments: Tenderness Left lower quadrant. No hernias, no peritonitis.  Genitourinary:    Comments: Post midline fissure, smaller as compared to prior exam, no masses, no bleeding no  infection Musculoskeletal:        General: No swelling. Normal range of motion.  Skin:    General: Skin is warm and dry.     Capillary Refill: Capillary refill takes less than 2 seconds.  Neurological:     General: No focal deficit present.     Mental Status: He is alert and oriented to person, place, and time.  Psychiatric:        Mood and Affect: Mood normal.        Behavior: Behavior normal.        Thought Content: Thought content normal.        Judgment: Judgment normal.     Assessment/Plan: Mr. Merryfield is a 60 year old male with anal fissure status post chemical sphincterotomy.  Compliance has been an issue.  Reiterated again about the importance of doing sitz bath's as well as twice daily nifedipine cream.  He seems to understand and is willing to be  compliant. In addition to this he does have subacute left lower quadrant abdominal pain that warrants further work-up.  Etiologies may include diverticulitis versus colitis versus potential small bowel pathology.  I will start with a CT scan of the abdomen pelvis with contrast and I will see him back in a few weeks once he completes the imaging studies.  There is no need for surgical intervention at this time.  Greater than 50% of the 30 minutes  visit was spent in counseling/coordination of care   Sterling Big, MD [facility] General Surgeon

## 2021-07-08 ENCOUNTER — Telehealth: Payer: Self-pay

## 2021-07-08 ENCOUNTER — Ambulatory Visit
Admission: RE | Admit: 2021-07-08 | Discharge: 2021-07-08 | Disposition: A | Discharge status: Home / Self Care | Payer: 59 | Source: Ambulatory Visit | Attending: Surgery | Admitting: Surgery

## 2021-07-08 ENCOUNTER — Ambulatory Visit: Admission: RE | Admit: 2021-07-08 | Payer: 59 | Source: Ambulatory Visit

## 2021-07-08 DIAGNOSIS — R1084 Generalized abdominal pain: Secondary | ICD-10-CM | POA: Diagnosis not present

## 2021-07-08 LAB — POCT I-STAT CREATININE: Creatinine, Ser: 0.9 mg/dL (ref 0.61–1.24)

## 2021-07-08 MED ORDER — IOHEXOL 350 MG/ML SOLN
100.0000 mL | Freq: Once | INTRAVENOUS | Status: AC | PRN
  Administered 2021-07-08: 100 mL via INTRAVENOUS

## 2021-07-08 NOTE — Telephone Encounter (Signed)
Pt notified of CT results. Verbalizes understanding. 

## 2021-07-09 ENCOUNTER — Other Ambulatory Visit: Payer: Self-pay

## 2021-07-09 ENCOUNTER — Encounter: Payer: Self-pay | Admitting: Surgery

## 2021-07-09 ENCOUNTER — Ambulatory Visit (INDEPENDENT_AMBULATORY_CARE_PROVIDER_SITE_OTHER): Payer: 59 | Admitting: Surgery

## 2021-07-09 VITALS — BP 122/80 | HR 60 | Temp 98.4°F | Ht 71.0 in | Wt 186.0 lb

## 2021-07-09 DIAGNOSIS — R1032 Left lower quadrant pain: Secondary | ICD-10-CM | POA: Diagnosis not present

## 2021-07-09 NOTE — Patient Instructions (Addendum)
If you have any concerns or questions, please feel free to call our office. See follow up appointment below.   Inguinal Hernia, Adult An inguinal hernia is when fat or your intestines push through a weak spot in a muscle where your leg meets your lower belly (groin). This causes a bulge. This kind of hernia could also be: In your scrotum, if you are male. In folds of skin around your vagina, if you are male. There are three types of inguinal hernias: Hernias that can be pushed back into the belly (are reducible). This type rarely causes pain. Hernias that cannot be pushed back into the belly (are incarcerated). Hernias that cannot be pushed back into the belly and lose their blood supply (are strangulated). This type needs emergency surgery. What are the causes? This condition is caused by having a weak spot in the muscles or tissues in your groin. This develops over time. The hernia may poke through the weak spot when you strain your lower belly muscles all of a sudden, such as when you: Lift a heavy object. Strain to poop (have a bowel movement). Trouble pooping (constipation) can lead to straining. Cough. What increases the risk? This condition is more likely to develop in: Males. Pregnant females. People who: Are overweight. Work in jobs that require long periods of standing or heavy lifting. Have had an inguinal hernia before. Smoke or have lung disease. These factors can lead to long-term (chronic) coughing. What are the signs or symptoms? Symptoms may depend on the size of the hernia. Often, a small hernia has no symptoms. Symptoms of a larger hernia may include: A bulge in the groin area. This is easier to see when standing. You might not be able to see it when you are lying down. Pain or burning in the groin. This may get worse when you lift, strain, or cough. A dull ache or a feeling of pressure in the groin. An abnormal bulge in the scrotum, in males. Symptoms of a  strangulated inguinal hernia may include: A bulge in your groin that is very painful and tender to the touch. A bulge that turns red or purple. Fever, feeling like you may vomit (nausea), and vomiting. Not being able to poop or to pass gas. How is this treated? Treatment depends on the size of your hernia and whether you have symptoms. If you do not have symptoms, your doctor may have you watch your hernia carefully and have you come in for follow-up visits. If your hernia is large or if you have symptoms, you may need surgery to repair the hernia. Follow these instructions at home: Lifestyle Avoid lifting heavy objects. Avoid standing for long amounts of time. Do not smoke or use any products that contain nicotine or tobacco. If you need help quitting, ask your doctor. Stay at a healthy weight. Prevent trouble pooping You may need to take these actions to prevent or treat trouble pooping: Drink enough fluid to keep your pee (urine) pale yellow. Take over-the-counter or prescription medicines. Eat foods that are high in fiber. These include beans, whole grains, and fresh fruits and vegetables. Limit foods that are high in fat and sugar. These include fried or sweet foods. General instructions You may try to push your hernia back in place by very gently pressing on it when you are lying down. Do not try to push the bulge back in if it will not go in easily. Watch your hernia for any changes in shape, size, or   color. Tell your doctor if you see any changes. Take over-the-counter and prescription medicines only as told by your doctor. Keep all follow-up visits. Contact a doctor if: You have a fever or chills. You have new symptoms. Your symptoms get worse. Get help right away if: You have pain in your groin that gets worse all of a sudden. You have a bulge in your groin that: Gets bigger all of a sudden, and it does not get smaller after that. Turns red or purple. Is painful when you  touch it. You are a male, and you have: Sudden pain in your scrotum. A sudden change in the size of your scrotum. You cannot push the hernia back in place by very gently pressing on it when you are lying down. You feel like you may vomit, and that feeling does not go away. You keep vomiting. You have a fast heartbeat. You cannot poop or pass gas. These symptoms may be an emergency. Get help right away. Call your local emergency services (911 in the [place] Do not wait to see if the symptoms will go away. Do not drive yourself to the hospital. Summary An inguinal hernia is when fat or your intestines push through a weak spot in a muscle where your leg meets your lower belly (groin). This causes a bulge. If you do not have symptoms, you may not need treatment. If you have symptoms or a large hernia, you may need surgery. Avoid lifting heavy objects. Also, avoid standing for long amounts of time. Do not try to push the bulge back in if it will not go in easily. This information is not intended to replace advice given to you by your health care provider. Make sure you discuss any questions you have with your health care provider. Document Revised: 06/25/2020 Document Reviewed: 06/25/2020 Elsevier Patient Education  2022 [employer] Inc[employer]  

## 2021-07-09 NOTE — Progress Notes (Signed)
Outpatient Surgical Follow Up  07/09/2021  Charles Shepard is an 60 y.o. male.   Chief Complaint  Patient presents with   Routine Post Op    sphincterotomy, sx date 7/26 & fu CT scan    HPI: Charles Shepard is following for the left lower quadrant abdominal pain.  He continues to have intermittent pains are mild to moderate intensity.  No specific alleviating or aggravating factors.  He did have a recent CT scan that have personally reviewed showing evidence of a left small inguinal hernia without complicating features.  No evidence of diverticulitis no evidence of any other intra-abdominal pathology.  Regarding her anal fissure he is itching and pain is better he is hematochezia has resolved.  He is now compliant with medications.  No other concerns regarding the anal fissure. I have personally show the patient the images of the CT scan  Past Medical History:  Diagnosis Date   Dyslipidemia    GERD (gastroesophageal reflux disease)    Heart murmur    Hypertension    Motion sickness    amusement park rides   Trigeminal autonomic cephalgias     Past Surgical History:  Procedure Laterality Date   APPENDECTOMY     BOTOX INJECTION N/A 06/03/2021   Procedure: BOTOX INJECTION;  Surgeon: Leafy Ro, MD;  Location: [facility];  Service: General;  Laterality: N/A;   broken leg  1999   Left, has a rod and a screw from ankle to knee   CARPAL TUNNEL RELEASE  05/21/2021   CERVICAL DISCECTOMY  2008   c-5   COLONOSCOPY  2014   Dr Servando Snare   COLONOSCOPY WITH PROPOFOL N/A 03/04/2020   Procedure: COLONOSCOPY WITH PROPOFOL;  Surgeon: Midge Minium, MD;  Location: [facility];  Service: Endoscopy;  Laterality: N/A;  priority 4   SPHINCTEROTOMY N/A 06/03/2021   Procedure: SPHINCTEROTOMY, chemical;  Surgeon: Leafy Ro, MD;  Location: [facility];  Service: General;  Laterality: N/A;    Family History  Problem Relation Age of Onset   Hyperlipidemia Mother    Diabetes Paternal Aunt    Colon cancer  Neg Hx     Social History:  reports that he quit smoking about 13 years ago. His smoking use included cigarettes. He has a 5.00 pack-year smoking history. He has quit using smokeless tobacco. He reports current alcohol use of about 12.0 standard drinks per week. He reports current drug use. Drug: Marijuana.  Allergies: No Known Allergies  Medications reviewed.    ROS Full ROS performed and is otherwise negative other than what is stated in HPI   BP 122/80   Pulse 60   Temp 98.4 F (36.9 C) (Oral)   Ht 5\' 11"  (1.803 m)   Wt 186 lb (84.4 kg)   SpO2 97%   BMI 25.94 kg/m   Physical Exam Vitals and nursing note reviewed. Exam conducted with a chaperone present.  Constitutional:      General: He is not in acute distress.    Appearance: Normal appearance. He is normal weight.  Pulmonary:     Effort: Pulmonary effort is normal. No respiratory distress.     Breath sounds: No stridor.  Abdominal:     General: Abdomen is flat. There is no distension.     Palpations: Abdomen is soft. There is no mass.     Tenderness: There is abdominal tenderness. There is no guarding or rebound.     Hernia: A hernia is present.  Comments: There is evidence of small left inguinal hernia that is reducible.  There is also evidence of mild pain left lower quadrant this is not in the left inguinal region.  No peritonitis.  Skin:    General: Skin is warm and dry.     Capillary Refill: Capillary refill takes less than 2 seconds.     Coloration: Skin is not jaundiced.  Neurological:     General: No focal deficit present.     Mental Status: He is alert and oriented to person, place, and time.  Psychiatric:        Mood and Affect: Mood normal.        Behavior: Behavior normal.        Thought Content: Thought content normal.        Judgment: Judgment normal.       Results for orders placed or performed during the hospital encounter of 07/08/21 (from the past 48 hour(s))  I-STAT creatinine      Status: None   Collection Time: 07/08/21  9:11 AM  Result Value Ref Range   Creatinine, Ser 0.90 0.61 - 1.24 mg/dL   CT Abdomen Pelvis W Contrast  Result Date: 07/08/2021 CLINICAL DATA:  Left lower quadrant pain, nausea.  Hernia suspected. EXAM: CT ABDOMEN AND PELVIS WITH CONTRAST TECHNIQUE: Multidetector CT imaging of the abdomen and pelvis was performed using the standard protocol following bolus administration of intravenous contrast. CONTRAST:  OMNIPAQUE IOHEXOL 350 MG/ML SOLN COMPARISON:  09/28/2018 FINDINGS: Lower chest: No acute abnormality Hepatobiliary: Diffuse low-density throughout the liver compatible with fatty infiltration. No focal abnormality. Gallbladder unremarkable. Pancreas: No focal abnormality or ductal dilatation. Spleen: No focal abnormality.  Normal size. Adrenals/Urinary Tract: No adrenal abnormality. No focal renal abnormality. No stones or hydronephrosis. Urinary bladder is unremarkable. Stomach/Bowel: Stomach, large and small bowel grossly unremarkable. Vascular/Lymphatic: No evidence of aneurysm or adenopathy. Reproductive: No visible focal abnormality. Other: No free fluid or free air. Tiny left inguinal hernia containing fat, stable since prior study. Musculoskeletal: No acute bony abnormality. IMPRESSION: Tiny left inguinal hernia containing fat, unchanged since prior study. Hepatic steatosis, stable. No acute findings. Electronically Signed   By: Charlett Nose M.D.   On: 07/08/2021 11:59    Assessment/Plan:  60 year old male with subacute left lower quadrant pain.  Differential includes irritable bowel syndrome versus musculoskeletal in nature.  Another differential diagnosis will be left symptomatic inguinal hernia that was discovered on CT scan.  I had an extensive discussion with the patient regarding the options.  We can certainly watch the hernia and if becomes more definitively symptomatic we can always do a repair.  At this time I do not have 100% certainty  that he is left abdominal pain is being caused by the hernia.  Discussed with him and he is in agreement.  We will follow him in a few months.  Regarding the anal fissure this seems to be working well.  Discussed with him the importance of continuation of proper hygiene, sitz bath's, high-fiber as well as a nifedipine cream.   Greater than 50% of the 30 minutes  visit was spent in counseling/coordination of care   Sterling Big, MD [facility] General Surgeon

## 2021-10-08 ENCOUNTER — Ambulatory Visit: Payer: Self-pay | Admitting: Surgery

## 2021-10-13 ENCOUNTER — Ambulatory Visit: Payer: Self-pay | Admitting: Surgery

## 2022-01-28 ENCOUNTER — Other Ambulatory Visit
Admission: RE | Admit: 2022-01-28 | Discharge: 2022-01-28 | Disposition: A | Discharge status: Home / Self Care | Payer: BC Managed Care – PPO | Source: Ambulatory Visit | Attending: Family Medicine | Admitting: Family Medicine

## 2022-01-28 DIAGNOSIS — R0789 Other chest pain: Secondary | ICD-10-CM | POA: Insufficient documentation

## 2022-01-28 LAB — TROPONIN I (HIGH SENSITIVITY): Troponin I (High Sensitivity): <2 ng/L (ref <18)

## 2023-05-27 ENCOUNTER — Other Ambulatory Visit: Payer: Self-pay | Admitting: Emergency Medicine

## 2023-05-27 DIAGNOSIS — R109 Unspecified abdominal pain: Secondary | ICD-10-CM

## 2023-05-27 DIAGNOSIS — R1032 Left lower quadrant pain: Secondary | ICD-10-CM

## 2023-05-31 ENCOUNTER — Encounter: Payer: Self-pay | Admitting: Emergency Medicine

## 2023-06-01 ENCOUNTER — Ambulatory Visit
Admission: RE | Admit: 2023-06-01 | Discharge: 2023-06-01 | Disposition: A | Discharge status: Home / Self Care | Payer: BC Managed Care – PPO | Source: Ambulatory Visit | Attending: Emergency Medicine | Admitting: Emergency Medicine

## 2023-06-01 DIAGNOSIS — R1032 Left lower quadrant pain: Secondary | ICD-10-CM

## 2023-06-01 DIAGNOSIS — R109 Unspecified abdominal pain: Secondary | ICD-10-CM

## 2023-06-01 MED ORDER — IOPAMIDOL (ISOVUE-300) INJECTION 61%
100.0000 mL | Freq: Once | INTRAVENOUS | Status: AC | PRN
  Administered 2023-06-01: 100 mL via INTRAVENOUS

## 2024-02-17 ENCOUNTER — Telehealth: Payer: Self-pay | Admitting: Surgery

## 2024-02-17 NOTE — Telephone Encounter (Signed)
 Have called the patient and lvm to call us and schedule an appt  with Dr Everlene Farrier for followup (30 min) unilateral Inguinal Hernia.

## 2024-02-21 ENCOUNTER — Ambulatory Visit: Payer: Self-pay | Admitting: Surgery

## 2024-02-21 ENCOUNTER — Encounter: Payer: Self-pay | Admitting: Surgery

## 2024-02-21 VITALS — BP 124/81 | HR 64 | Temp 98.1°F | Ht 70.0 in | Wt 192.2 lb

## 2024-02-21 DIAGNOSIS — K4090 Unilateral inguinal hernia, without obstruction or gangrene, not specified as recurrent: Secondary | ICD-10-CM | POA: Diagnosis not present

## 2024-02-21 NOTE — Patient Instructions (Signed)
 Groin Hernia (Inguinal Hernia) in Adults: What to Know  A hernia happens when an organ or tissue in your body pushes out through a weak spot in the muscles of your belly. This makes a bulge. A groin hernia is also called an inguinal hernia. It's found in your groin, which is the area where your leg meets your lower belly. This kind of hernia could also be: In your scrotum, if you're male. In the folds of skin around your vagina, if you're male. You may be able to push the bulge back into your belly. If you can't push it in and blood flow is cut off to the hernia, you'll need surgery right away. What are the causes? A groin hernia may happen when you strain your belly muscles, such as when you: Lift a heavy object. Strain to poop. Cough. What increases the risk? You may be more likely to get a groin hernia if: You're male. You're 50 years or older. You're pregnant. You've had a groin hernia or belly surgery before. You smoke. You're overweight. You work at a job where you need to stand a lot or lift heavy things. What are the signs or symptoms? Symptoms may depend on how big the hernia is. If it's small, you may not have symptoms. If it's bigger, you may have: A bulge near your groin or genitals. Pain or burning in your groin. A dull ache or feeling of pressure in your groin. If blood flow is cut off to the tissues inside the hernia, you may also: Feel pain and tenderness when you touch the bulge. The skin over it may turn red or purple. Have a fever. Throw up or feel like you may throw up. Have trouble pooping or passing gas. How is this treated? Treatment depends on how big the hernia is and what symptoms you have. You may need: To be watched to see if the bulge grows bigger. Surgery. This may be done if the hernia is big or if you have symptoms. Follow these instructions at home: Lifestyle Ask if it's OK for you to lift. Try not to stand for long periods of time. Do not  smoke, vape, or use nicotine or tobacco. Stay at a healthy weight. Try not to do things that put pressure on your hernia. Preventing trouble pooping You may need to take these steps to help prevent or treat trouble pooping (constipation): Take medicines to help you poop. Eat foods high in fiber, like beans, whole grains, and fresh fruits and vegetables. Drink more fluids as told. General instructions Try to push the hernia back in place by very gently pressing on it while lying down. Do not try to force it back in if it won't push in easily. Watch your hernia for any changes in: Shape. Size. Color. Take your medicines only as told. Contact a doctor if: You have a fever. You have new symptoms. Your symptoms get worse. You can't poop or pass gas. Get help right away if: Your bulge: Starts to hurt a lot. Changes color. You have sudden pain in your scrotum, or your scrotum changes size. You can't gently push the hernia back in place. You feel like you may vomit, and that feeling does not go away. You keep throwing up or feeling like you need to throw up. These symptoms may be an emergency. Call 911 right away. Do not wait to see if the symptoms will go away. Do not drive yourself to the hospital. This information is  not intended to replace advice given to you by your health care provider. Make sure you discuss any questions you have with your health care provider. Document Revised: 06/24/2023 Document Reviewed: 06/24/2023 Elsevier Patient Education  2024 [employer].

## 2024-02-22 ENCOUNTER — Encounter: Payer: Self-pay | Admitting: Surgery

## 2024-02-22 NOTE — Progress Notes (Signed)
 Patient ID: Charles Shepard, male   DOB: [birth year], 63 y.o.   MRN: [id number]  HPI Charles Shepard is a 63 y.o. male seen in consultation at the request of Mr. Carolene Chute.  For left inguinal hernia.  He reports that he has some left-sided lower quadrant pain that is intermittent and is mild.  There is no specific aggravating or alleviating factors.  He did have a CT scan 9 months ago showing evidence of a reducible left inguinal hernia without any acute intra-abdominal abnormalities. Did have a prior history of appendectomy and chemical sphincterotomy with Botox.  He is able to perform more than 4 METS of activity without any shortness of breath or chest pain.  He does have significant reflux symptoms and had a recent episode of atypical chest pain related to reflux.  he was seen by cardiology who determined that the chest pain was not cardiac in origin. Episode of reflux or atypical chest pain happening after he pick up his grandson at [facility]. Recent CMP and CBC is completely normal    HPI  Past Medical History:  Diagnosis Date   Dyslipidemia    GERD (gastroesophageal reflux disease)    Heart murmur    Hypertension    Motion sickness    amusement park rides   Trigeminal autonomic cephalgias     Past Surgical History:  Procedure Laterality Date   APPENDECTOMY     BOTOX INJECTION N/A 06/03/2021   Procedure: BOTOX INJECTION;  Surgeon: Alben Alma, MD;  Location: [facility];  Service: General;  Laterality: N/A;   broken leg  1999   Left, has a rod and a screw from ankle to knee   CARPAL TUNNEL RELEASE  05/21/2021   CERVICAL DISCECTOMY  2008   c-5   COLONOSCOPY  2014   Dr Ole Berkeley   COLONOSCOPY WITH PROPOFOL N/A 03/04/2020   Procedure: COLONOSCOPY WITH PROPOFOL;  Surgeon: Marnee Sink, MD;  Location: [facility];  Service: Endoscopy;  Laterality: N/A;  priority 4   SPHINCTEROTOMY N/A 06/03/2021   Procedure: SPHINCTEROTOMY, chemical;  Surgeon: Alben Alma, MD;  Location:  [facility];  Service: General;  Laterality: N/A;    Family History  Problem Relation Age of Onset   Hyperlipidemia Mother    Diabetes Paternal Aunt    Colon cancer Neg Hx     Social History Social History   Tobacco Use   Smoking status: Former    Current packs/day: 0.00    Average packs/day: 0.5 packs/day for 10.0 years (5.0 ttl pk-yrs)    Types: Cigarettes    Start date: 11/09/1997    Quit date: 11/10/2007    Years since quitting: 16.2   Smokeless tobacco: Former  Building services engineer status: Never Used  Substance Use Topics   Alcohol use: Yes    Alcohol/week: 12.0 standard drinks of alcohol    Types: 12 Cans of beer per week    Comment: BETWEEN 3-12 BEERS A WEEK   Drug use: Yes    Types: Marijuana    No Known Allergies  Current Outpatient Medications  Medication Sig Dispense Refill   amLODipine (NORVASC) 5 MG tablet Take 5 mg by mouth daily.  11   ASPIRIN LOW DOSE 81 MG EC tablet Take 81 mg by mouth daily.  1   rosuvastatin (CRESTOR) 20 MG tablet TAKE 1 TABLET BY MOUTH EVERY DAY (Patient taking differently: Take 20 mg by mouth daily.) 90 tablet 1   No current  facility-administered medications for this visit.     Review of Systems Full ROS  was asked and was negative except for the information on the HPI  Physical Exam Blood pressure 124/81, pulse 64, temperature 98.1 F (36.7 C), temperature source Oral, height 5\' 10"  (1.778 m), weight 192 lb 3.2 oz (87.2 kg), SpO2 98%. CONSTITUTIONAL: NAD. EYES: Pupils are equal, round,  Sclera are non-icteric. EARS, NOSE, MOUTH AND THROAT: The oropharynx is clear. The oral mucosa is pink and moist. Hearing is intact to voice. LYMPH NODES:  Lymph nodes in the neck are normal. RESPIRATORY:  Lungs are clear. There is normal respiratory effort, with equal breath sounds bilaterally, and without pathologic use of accessory muscles. CARDIOVASCULAR: Heart is regular without murmurs, gallops, or rubs. GI: The abdomen is  soft, nontender,  and nondistended. There are no palpable masses. There is no hepatosplenomegaly. There are normal bowel sounds .  There is evidence of a reducible left inguinal hernia.  Mild discomfort upon palpation GU: Rectal deferred.   MUSCULOSKELETAL: Normal muscle strength and tone. No cyanosis or edema.   SKIN: Turgor is good and there are no pathologic skin lesions or ulcers. NEUROLOGIC: Motor and sensation is grossly normal. Cranial nerves are grossly intact. PSYCH:  Oriented to person, place and time. Affect is normal.  Data Reviewed  I have personally reviewed the patient's imaging, laboratory findings and medical records.    Assessment/Plan 63 year old male with mildly symptomatic left inguinal hernia.  Cussed with patient in detail about his disease process.  Given that he has some symptoms and the hernias and large I do recommend surgical intervention.  Discussed with him in detail about what surgery will entail.  I do think that he is a good candidate for robotic approach.  Procedure discussed with the patient in detail.  Risk, benefits and possible complications including but not limited to: Bleeding, infection, recurrence, chronic pain.  He understands and wishes to proceed.  He is going to look into his schedule and figure out what is a good time for him.  He will call us  back when he is ready Note that spent 40 minutes in this encounter including personally reviewing imaging studies, coordinating his care, placing orders, reviewing medical records and performing documentation. Copy of this report was sent to the referring provider   Evelia Hipp, MD FACS General Surgeon 02/22/2024, 1:44 PM

## 2024-04-10 ENCOUNTER — Other Ambulatory Visit: Payer: Self-pay | Admitting: Internal Medicine

## 2024-04-10 DIAGNOSIS — R0789 Other chest pain: Secondary | ICD-10-CM

## 2024-05-04 ENCOUNTER — Other Ambulatory Visit: Payer: Self-pay | Admitting: Family Medicine

## 2024-05-04 DIAGNOSIS — R9389 Abnormal findings on diagnostic imaging of other specified body structures: Secondary | ICD-10-CM

## 2024-05-04 DIAGNOSIS — M47816 Spondylosis without myelopathy or radiculopathy, lumbar region: Secondary | ICD-10-CM

## 2024-05-05 ENCOUNTER — Ambulatory Visit
Admission: RE | Admit: 2024-05-05 | Discharge: 2024-05-05 | Disposition: A | Discharge status: Home / Self Care | Source: Ambulatory Visit | Attending: Family Medicine | Admitting: Family Medicine

## 2024-05-05 DIAGNOSIS — R9389 Abnormal findings on diagnostic imaging of other specified body structures: Secondary | ICD-10-CM

## 2024-05-05 DIAGNOSIS — M47816 Spondylosis without myelopathy or radiculopathy, lumbar region: Secondary | ICD-10-CM

## 2024-05-23 ENCOUNTER — Other Ambulatory Visit: Payer: Self-pay | Admitting: Family Medicine

## 2024-05-23 DIAGNOSIS — R1031 Right lower quadrant pain: Secondary | ICD-10-CM

## 2024-06-13 ENCOUNTER — Ambulatory Visit: Admitting: Urology

## 2024-06-13 VITALS — BP 128/89 | HR 61 | Ht 71.0 in | Wt 194.0 lb

## 2024-06-13 DIAGNOSIS — R1031 Right lower quadrant pain: Secondary | ICD-10-CM

## 2024-06-13 DIAGNOSIS — R3129 Other microscopic hematuria: Secondary | ICD-10-CM | POA: Diagnosis not present

## 2024-06-13 LAB — URINALYSIS, COMPLETE
Bilirubin, UA: NEGATIVE
Glucose, UA: NEGATIVE
Ketones, UA: NEGATIVE
Leukocytes,UA: NEGATIVE
Nitrite, UA: NEGATIVE
Protein,UA: NEGATIVE
Specific Gravity, UA: 1.015 (ref 1.005–1.030)
Urobilinogen, Ur: 0.2 mg/dL (ref 0.2–1.0)
pH, UA: 6 (ref 5.0–7.5)

## 2024-06-13 LAB — MICROSCOPIC EXAMINATION

## 2024-06-13 NOTE — H&P (View-Only) (Signed)
 06/13/2024 12:35 PM   Peretz L Boese [birth year] [id number]  Referring provider: Cyrus Selinda Moose, PA-C [address] [place],  [place] [address]  Chief Complaint  Patient presents with   Abdominal Pain    HPI: Charles Shepard is a 63 y.o. male referred for evaluation of abdominal pain  PCP visit 05/19/2024 with a ~1 month history of right lower quadrant abdominal pain which she described as a deep visceral type pain.  Intermittent radiation to the right hemiscrotum  No nausea, vomiting, fever, chills No bothersome LUTS CT abdomen pelvis with contrast performed at [facility] in [place] showed bilateral renal cortical cysts and prostate enlargement He has been treated with empiric antibiotics, meloxicam and tamsulosin.  He states the only significant relief has been with tamsulosin which he has been taking ~2 weeks and he states as long as he takes the medication his pain is minimal Denies gross hematuria however has a long history of microhematuria with negative evaluations UA at the time of his PCP visit showed 2+ blood on dipstick and 8 RBCs per high-power field   PMH: Past Medical History:  Diagnosis Date   Dyslipidemia    GERD (gastroesophageal reflux disease)    Heart murmur    Hypertension    Motion sickness    amusement park rides   Trigeminal autonomic cephalgias     Surgical History: Past Surgical History:  Procedure Laterality Date   APPENDECTOMY     BOTOX  INJECTION N/A 06/03/2021   Procedure: BOTOX  INJECTION;  Surgeon: Jordis Laneta FALCON, MD;  Location: [facility];  Service: General;  Laterality: N/A;   broken leg  1999   Left, has a rod and a screw from ankle to knee   CARPAL TUNNEL RELEASE  05/21/2021   CERVICAL DISCECTOMY  2008   c-5   COLONOSCOPY  2014   Dr Jinny   COLONOSCOPY WITH PROPOFOL  N/A 03/04/2020   Procedure: COLONOSCOPY WITH PROPOFOL ;  Surgeon: Jinny Carmine, MD;  Location: [facility];  Service: Endoscopy;  Laterality: N/A;   priority 4   SPHINCTEROTOMY N/A 06/03/2021   Procedure: SPHINCTEROTOMY, chemical;  Surgeon: Jordis Laneta FALCON, MD;  Location: [facility];  Service: General;  Laterality: N/A;    Home Medications:  Allergies as of 06/13/2024   No Known Allergies      Medication List        Accurate as of June 13, 2024 12:35 PM. If you have any questions, ask your nurse or doctor.          ALPRAZolam 0.5 MG tablet Commonly known as: XANAX Take 0.5 mg by mouth.   amLODipine 5 MG tablet Commonly known as: NORVASC Take 5 mg by mouth daily.   Aspirin Low Dose 81 MG tablet Generic drug: aspirin EC Take 81 mg by mouth daily.   dexlansoprazole  60 MG capsule Commonly known as: DEXILANT  Take 60 mg by mouth.   gabapentin 300 MG capsule Commonly known as: NEURONTIN Take 300 mg by mouth.   meloxicam 15 MG tablet Commonly known as: MOBIC Take 15 mg by mouth daily.   omeprazole 40 MG capsule Commonly known as: PRILOSEC Take 40 mg by mouth daily.   rosuvastatin  20 MG tablet Commonly known as: CRESTOR  TAKE 1 TABLET BY MOUTH EVERY DAY   simvastatin  20 MG tablet Commonly known as: ZOCOR  Take 1 tablet by mouth at bedtime.   sulfamethoxazole-trimethoprim 800-160 MG tablet Commonly known as: BACTRIM DS Take 1 tablet by mouth 2 (two) times daily.  tamsulosin 0.4 MG Caps capsule Commonly known as: FLOMAX Take 0.4 mg by mouth.        Allergies: No Known Allergies  Family History: Family History  Problem Relation Age of Onset   Hyperlipidemia Mother    Diabetes Paternal Aunt    Colon cancer Neg Hx     Social History:  reports that he quit smoking about 16 years ago. His smoking use included cigarettes. He started smoking about 26 years ago. He has a 5 pack-year smoking history. He has quit using smokeless tobacco. He reports current alcohol use of about 12.0 standard drinks of alcohol per week. He reports current drug use. Drug: Marijuana.   Physical Exam: BP 128/89   Pulse 61   Ht  5' 11 (1.803 m)   Wt 194 lb (88 kg)   BMI 27.06 kg/m   Constitutional:  Alert and oriented, No acute distress. HEENT: [place] AT Respiratory: Normal respiratory effort, no increased work of breathing. Psychiatric: Normal mood and affect.  Laboratory Data:  Urinalysis Dipstick 2+ blood/microscopy 3-10 RBC  Pertinent imaging CT images were not available for review   Assessment & Plan:   63 y.o. male with 84-month history of right lower quadrant abdominal pain with radiation to right testis on occasion Associated with microhematuria though he has a long history of microhematuria He has seen some relief with tamsulosin Recommend further evaluation with cystoscopy with right retrograde pyelogram.  We discussed the possible need for right ureteroscopy with biopsy, right ureteral stent placement or bladder biopsy/TURBT if any abnormalities are identified The procedures were discussed in detail including potential risks of bleeding and infection.    [name], MD  [facility] [address] [place], [place] [address] [phone]

## 2024-06-13 NOTE — Progress Notes (Unsigned)
 06/13/2024 12:35 PM   Peretz L Boese [birth year] [id number]  Referring provider: Cyrus Selinda Moose, PA-C [address] [place],  [place] [address]  Chief Complaint  Patient presents with   Abdominal Pain    HPI: HANSON MEDEIROS is a 63 y.o. male referred for evaluation of abdominal pain  PCP visit 05/19/2024 with a ~1 month history of right lower quadrant abdominal pain which she described as a deep visceral type pain.  Intermittent radiation to the right hemiscrotum  No nausea, vomiting, fever, chills No bothersome LUTS CT abdomen pelvis with contrast performed at [facility] in [place] showed bilateral renal cortical cysts and prostate enlargement He has been treated with empiric antibiotics, meloxicam and tamsulosin.  He states the only significant relief has been with tamsulosin which he has been taking ~2 weeks and he states as long as he takes the medication his pain is minimal Denies gross hematuria however has a long history of microhematuria with negative evaluations UA at the time of his PCP visit showed 2+ blood on dipstick and 8 RBCs per high-power field   PMH: Past Medical History:  Diagnosis Date   Dyslipidemia    GERD (gastroesophageal reflux disease)    Heart murmur    Hypertension    Motion sickness    amusement park rides   Trigeminal autonomic cephalgias     Surgical History: Past Surgical History:  Procedure Laterality Date   APPENDECTOMY     BOTOX  INJECTION N/A 06/03/2021   Procedure: BOTOX  INJECTION;  Surgeon: Jordis Laneta FALCON, MD;  Location: [facility];  Service: General;  Laterality: N/A;   broken leg  1999   Left, has a rod and a screw from ankle to knee   CARPAL TUNNEL RELEASE  05/21/2021   CERVICAL DISCECTOMY  2008   c-5   COLONOSCOPY  2014   Dr Jinny   COLONOSCOPY WITH PROPOFOL  N/A 03/04/2020   Procedure: COLONOSCOPY WITH PROPOFOL ;  Surgeon: Jinny Carmine, MD;  Location: [facility];  Service: Endoscopy;  Laterality: N/A;   priority 4   SPHINCTEROTOMY N/A 06/03/2021   Procedure: SPHINCTEROTOMY, chemical;  Surgeon: Jordis Laneta FALCON, MD;  Location: [facility];  Service: General;  Laterality: N/A;    Home Medications:  Allergies as of 06/13/2024   No Known Allergies      Medication List        Accurate as of June 13, 2024 12:35 PM. If you have any questions, ask your nurse or doctor.          ALPRAZolam 0.5 MG tablet Commonly known as: XANAX Take 0.5 mg by mouth.   amLODipine 5 MG tablet Commonly known as: NORVASC Take 5 mg by mouth daily.   Aspirin Low Dose 81 MG tablet Generic drug: aspirin EC Take 81 mg by mouth daily.   dexlansoprazole  60 MG capsule Commonly known as: DEXILANT  Take 60 mg by mouth.   gabapentin 300 MG capsule Commonly known as: NEURONTIN Take 300 mg by mouth.   meloxicam 15 MG tablet Commonly known as: MOBIC Take 15 mg by mouth daily.   omeprazole 40 MG capsule Commonly known as: PRILOSEC Take 40 mg by mouth daily.   rosuvastatin  20 MG tablet Commonly known as: CRESTOR  TAKE 1 TABLET BY MOUTH EVERY DAY   simvastatin  20 MG tablet Commonly known as: ZOCOR  Take 1 tablet by mouth at bedtime.   sulfamethoxazole-trimethoprim 800-160 MG tablet Commonly known as: BACTRIM DS Take 1 tablet by mouth 2 (two) times daily.  tamsulosin 0.4 MG Caps capsule Commonly known as: FLOMAX Take 0.4 mg by mouth.        Allergies: No Known Allergies  Family History: Family History  Problem Relation Age of Onset   Hyperlipidemia Mother    Diabetes Paternal Aunt    Colon cancer Neg Hx     Social History:  reports that he quit smoking about 16 years ago. His smoking use included cigarettes. He started smoking about 26 years ago. He has a 5 pack-year smoking history. He has quit using smokeless tobacco. He reports current alcohol use of about 12.0 standard drinks of alcohol per week. He reports current drug use. Drug: Marijuana.   Physical Exam: BP 128/89   Pulse 61   Ht  5' 11 (1.803 m)   Wt 194 lb (88 kg)   BMI 27.06 kg/m   Constitutional:  Alert and oriented, No acute distress. HEENT: [place] AT Respiratory: Normal respiratory effort, no increased work of breathing. Psychiatric: Normal mood and affect.  Laboratory Data:  Urinalysis Dipstick 2+ blood/microscopy 3-10 RBC  Pertinent imaging CT images were not available for review   Assessment & Plan:   63 y.o. male with 84-month history of right lower quadrant abdominal pain with radiation to right testis on occasion Associated with microhematuria though he has a long history of microhematuria He has seen some relief with tamsulosin Recommend further evaluation with cystoscopy with right retrograde pyelogram.  We discussed the possible need for right ureteroscopy with biopsy, right ureteral stent placement or bladder biopsy/TURBT if any abnormalities are identified The procedures were discussed in detail including potential risks of bleeding and infection.    [name], MD  [facility] [address] [place], [place] [address] [phone]

## 2024-06-14 ENCOUNTER — Encounter: Payer: Self-pay | Admitting: Urology

## 2024-06-16 ENCOUNTER — Other Ambulatory Visit: Payer: Self-pay

## 2024-06-16 ENCOUNTER — Telehealth: Payer: Self-pay

## 2024-06-16 DIAGNOSIS — R1031 Right lower quadrant pain: Secondary | ICD-10-CM

## 2024-06-16 DIAGNOSIS — R3129 Other microscopic hematuria: Secondary | ICD-10-CM

## 2024-06-16 NOTE — Progress Notes (Signed)
 Surgical Physician Order Form [facility] [place]  * Scheduling expectation : 06/22/2024  *Length of Case: 30 min  *Clearance needed: no  *Anticoagulation Instructions: N/A  *Aspirin Instructions: N/A  *Post-op visit Date/Instructions:  TBD  *Diagnosis: Right abd pain; microhematuria  *Procedure:  cystoscopy; right retrograde pyelogram; possible right ureteroscopy w/ bx; possible right ureteral stent placement; possible bladder biopsy/TURBT   Additional orders: N/A  -Admit type: OUTpatient  -Anesthesia: Choice  -VTE Prophylaxis Standing Order SCD's       Other:   -Standing Lab Orders Per Anesthesia    Lab other: None  -Standing Test orders EKG/Chest x-ray per Anesthesia       Test other:   - Medications:  Ancef  2gm IV  -Other orders:  N/A

## 2024-06-16 NOTE — Progress Notes (Signed)
   [facility] Surgical Posting Form  Surgery Date: Date: 06/22/2024  Surgeon: Dr. 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

## 2024-06-16 NOTE — Telephone Encounter (Signed)
 Per Dr. 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 Tiedt was contacted and possible surgical dates were discussed, Thursday August 14th, 2025 was agreed upon for surgery.   Patient was directed to call [phone] between 1-3pm the day before surgery to find out surgical arrival time.  Instructions were given not to eat or drink from midnight on the night before surgery and have a driver for the day of surgery. On the surgery day patient was instructed to enter through the Medical Mall entrance of [facility] report the Same Day Surgery desk.   Pre-Admit Testing will be in contact via phone to set up an interview with the anesthesia team to review your history and medications prior to surgery.

## 2024-06-20 ENCOUNTER — Encounter
Admission: RE | Admit: 2024-06-20 | Discharge: 2024-06-20 | Disposition: A | Discharge status: Home / Self Care | Source: Ambulatory Visit | Attending: Urology | Admitting: Urology

## 2024-06-20 ENCOUNTER — Other Ambulatory Visit: Payer: Self-pay

## 2024-06-20 DIAGNOSIS — I10 Essential (primary) hypertension: Secondary | ICD-10-CM

## 2024-06-20 DIAGNOSIS — R3129 Other microscopic hematuria: Secondary | ICD-10-CM

## 2024-06-20 DIAGNOSIS — R1031 Right lower quadrant pain: Secondary | ICD-10-CM

## 2024-06-20 HISTORY — DX: Other chest pain: R07.89

## 2024-06-20 HISTORY — DX: Right lower quadrant pain: R10.31

## 2024-06-20 HISTORY — DX: Dyspnea, unspecified: R06.00

## 2024-06-20 NOTE — Patient Instructions (Addendum)
 Your procedure is scheduled on: 06/22/24 - Thursday Report to the Registration Desk on the 1st floor of the Medical Mall. To find out your arrival time, please call [phone] between 1PM - 3PM on: 06/21/24 - Wednesday If your arrival time is 6:00 am, do not arrive before that time as the Medical Mall entrance doors do not open until 6:00 am.  REMEMBER: Instructions that are not followed completely may result in serious medical risk, up to and including death; or upon the discretion of your surgeon and anesthesiologist your surgery may need to be rescheduled.  Do not eat food or drink any liquids after midnight the night before surgery.  No gum chewing or hard candies.   One week prior to surgery: Stop Anti-inflammatories (NSAIDS) such as Advil, Aleve , Ibuprofen, Motrin, Naproxen , Naprosyn  and Aspirin based products such as Excedrin, Goody's Powder, BC Powder. You may take Tylenol  if needed for pain up until the day of surgery.  Stop ANY OVER THE COUNTER supplements until after surgery.  ON THE DAY OF SURGERY ONLY TAKE THESE MEDICATIONS WITH SIPS OF WATER :  amLODipine (NORVASC)  dexlansoprazole  (DEXILANT )       No Alcohol for 24 hours before or after surgery.  No Smoking including e-cigarettes for 24 hours before surgery.  No chewable tobacco products for at least 6 hours before surgery.  No nicotine patches on the day of surgery.  Do not use any recreational drugs for at least a week (preferably 2 weeks) before your surgery.  Please be advised that the combination of cocaine and anesthesia may have negative outcomes, up to and including death. If you test positive for cocaine, your surgery will be cancelled.  On the morning of surgery brush your teeth with toothpaste and water , you may rinse your mouth with mouthwash if you wish. Do not swallow any toothpaste or mouthwash.  Do not wear jewelry, make-up, hairpins, clips or nail polish.  For welded (permanent) jewelry:  bracelets, anklets, waist bands, etc.  Please have this removed prior to surgery.  If it is not removed, there is a chance that hospital personnel will need to cut it off on the day of surgery.  Do not wear lotions, powders, or perfumes.   Do not shave body hair from the neck down 48 hours before surgery.  Contact lenses, hearing aids and dentures may not be worn into surgery.  Do not bring valuables to the hospital. [facility] is not responsible for any missing/lost belongings or valuables.   Notify your doctor if there is any change in your medical condition (cold, fever, infection).  Wear comfortable clothing (specific to your surgery type) to the hospital.  After surgery, you can help prevent lung complications by doing breathing exercises.  Take deep breaths and cough every 1-2 hours. Your doctor may order a device called an Incentive Spirometer to help you take deep breaths.  When coughing or sneezing, hold a pillow firmly against your incision with both hands. This is called "splinting." Doing this helps protect your incision. It also decreases belly discomfort.  If you are being admitted to the hospital overnight, leave your suitcase in the car. After surgery it may be brought to your room.  In case of increased patient census, it may be necessary for you, the patient, to continue your postoperative care in the Same Day Surgery department.  If you are being discharged the day of surgery, you will not be allowed to drive home. You will need a responsible individual  to drive you home and stay with you for 24 hours after surgery.   If you are taking public transportation, you will need to have a responsible individual with you.  Please call the Pre-admissions Testing Dept. at [phone] if you have any questions about these instructions.  Surgery Visitation Policy:  Patients having surgery or a procedure may have two visitors.  Children under the age of 46 must have an  adult with them who is not the patient.  Inpatient Visitation:    Visiting hours are 7 a.m. to 8 p.m. Up to four visitors are allowed at one time in a patient room. The visitors may rotate out with other people during the day.  One visitor age 70 or older may stay with the patient overnight and must be in the room by 8 p.m.   Merchandiser, retail to address health-related social needs:  [email or web address]

## 2024-06-21 ENCOUNTER — Encounter
Admission: RE | Admit: 2024-06-21 | Discharge: 2024-06-21 | Disposition: A | Discharge status: Home / Self Care | Source: Ambulatory Visit | Attending: Urology | Admitting: Urology

## 2024-06-21 ENCOUNTER — Encounter: Payer: Self-pay | Admitting: Urology

## 2024-06-21 DIAGNOSIS — I10 Essential (primary) hypertension: Secondary | ICD-10-CM | POA: Insufficient documentation

## 2024-06-21 DIAGNOSIS — Z87891 Personal history of nicotine dependence: Secondary | ICD-10-CM | POA: Diagnosis not present

## 2024-06-21 DIAGNOSIS — Z01810 Encounter for preprocedural cardiovascular examination: Secondary | ICD-10-CM | POA: Diagnosis not present

## 2024-06-21 DIAGNOSIS — R1031 Right lower quadrant pain: Secondary | ICD-10-CM | POA: Diagnosis present

## 2024-06-21 DIAGNOSIS — R3129 Other microscopic hematuria: Secondary | ICD-10-CM | POA: Diagnosis present

## 2024-06-21 MED ORDER — CEFAZOLIN SODIUM-DEXTROSE 2-4 GM/100ML-% IV SOLN
2.0000 g | INTRAVENOUS | Status: AC
  Administered 2024-06-22: 2 g via INTRAVENOUS

## 2024-06-21 MED ORDER — ORAL CARE MOUTH RINSE: 15.0000 mL | Freq: Once | OROMUCOSAL | Status: AC

## 2024-06-21 MED ORDER — LACTATED RINGERS IV SOLN: INTRAVENOUS | Status: DC

## 2024-06-21 MED ORDER — CHLORHEXIDINE GLUCONATE 0.12 % MT SOLN
15.0000 mL | Freq: Once | OROMUCOSAL | Status: AC
  Administered 2024-06-22: 15 mL via OROMUCOSAL

## 2024-06-22 ENCOUNTER — Ambulatory Visit

## 2024-06-22 ENCOUNTER — Ambulatory Visit
Admission: RE | Admit: 2024-06-22 | Discharge: 2024-06-22 | Disposition: A | Discharge status: Home / Self Care | Attending: Urology | Admitting: Urology

## 2024-06-22 ENCOUNTER — Other Ambulatory Visit: Payer: Self-pay

## 2024-06-22 ENCOUNTER — Ambulatory Visit: Payer: Self-pay | Admitting: Urgent Care

## 2024-06-22 ENCOUNTER — Encounter: Payer: Self-pay | Admitting: Urology

## 2024-06-22 ENCOUNTER — Encounter
Admission: RE | Disposition: A | Discharge status: Home / Self Care | Payer: Self-pay | Source: Home / Self Care | Attending: Urology

## 2024-06-22 DIAGNOSIS — Z87898 Personal history of other specified conditions: Secondary | ICD-10-CM

## 2024-06-22 DIAGNOSIS — Z87891 Personal history of nicotine dependence: Secondary | ICD-10-CM | POA: Insufficient documentation

## 2024-06-22 DIAGNOSIS — R3129 Other microscopic hematuria: Secondary | ICD-10-CM | POA: Diagnosis not present

## 2024-06-22 DIAGNOSIS — R1031 Right lower quadrant pain: Secondary | ICD-10-CM

## 2024-06-22 DIAGNOSIS — I10 Essential (primary) hypertension: Secondary | ICD-10-CM | POA: Insufficient documentation

## 2024-06-22 HISTORY — DX: Dizziness and giddiness: R42

## 2024-06-22 HISTORY — DX: Hyperlipidemia, unspecified: E78.5

## 2024-06-22 HISTORY — DX: Prediabetes: R73.03

## 2024-06-22 HISTORY — DX: Dysphonia: R49.0

## 2024-06-22 HISTORY — PX: CYSTOSCOPY W/ RETROGRADES: SHX1426

## 2024-06-22 HISTORY — DX: Long term (current) use of aspirin: Z79.82

## 2024-06-22 HISTORY — DX: Other intervertebral disc degeneration, lumbar region without mention of lumbar back pain or lower extremity pain: M51.369

## 2024-06-22 SURGERY — CYSTOSCOPY, WITH RETROGRADE PYELOGRAM
Anesthesia: General | Laterality: Right

## 2024-06-22 MED ORDER — OXYCODONE HCL 5 MG PO TABS: 5.0000 mg | ORAL_TABLET | Freq: Once | ORAL | Status: DC | PRN

## 2024-06-22 MED ORDER — DEXAMETHASONE SODIUM PHOSPHATE 10 MG/ML IJ SOLN
INTRAMUSCULAR | Status: AC
  Filled 2024-06-22: qty 1

## 2024-06-22 MED ORDER — KETOROLAC TROMETHAMINE 30 MG/ML IJ SOLN
INTRAMUSCULAR | Status: AC
  Filled 2024-06-22: qty 1

## 2024-06-22 MED ORDER — DEXAMETHASONE SODIUM PHOSPHATE 10 MG/ML IJ SOLN
INTRAMUSCULAR | Status: DC | PRN
  Administered 2024-06-22: 10 mg via INTRAVENOUS

## 2024-06-22 MED ORDER — CHLORHEXIDINE GLUCONATE 0.12 % MT SOLN
OROMUCOSAL | Status: AC
  Filled 2024-06-22: qty 15

## 2024-06-22 MED ORDER — PROPOFOL 10 MG/ML IV BOLUS
INTRAVENOUS | Status: DC | PRN
  Administered 2024-06-22: 200 mg via INTRAVENOUS

## 2024-06-22 MED ORDER — PROPOFOL 10 MG/ML IV BOLUS
INTRAVENOUS | Status: AC
  Filled 2024-06-22: qty 20

## 2024-06-22 MED ORDER — ACETAMINOPHEN 10 MG/ML IV SOLN
INTRAVENOUS | Status: DC | PRN
  Administered 2024-06-22: 1000 mg via INTRAVENOUS

## 2024-06-22 MED ORDER — ACETAMINOPHEN 10 MG/ML IV SOLN
INTRAVENOUS | Status: AC
  Filled 2024-06-22: qty 100

## 2024-06-22 MED ORDER — OXYCODONE HCL 5 MG PO TABS
5.0000 mg | ORAL_TABLET | Freq: Once | ORAL | Status: AC | PRN
  Administered 2024-06-22: 5 mg via ORAL

## 2024-06-22 MED ORDER — KETOROLAC TROMETHAMINE 30 MG/ML IJ SOLN
INTRAMUSCULAR | Status: DC | PRN
  Administered 2024-06-22: 30 mg via INTRAVENOUS

## 2024-06-22 MED ORDER — DROPERIDOL 2.5 MG/ML IJ SOLN: 0.6250 mg | Freq: Once | INTRAMUSCULAR | Status: DC | PRN

## 2024-06-22 MED ORDER — OXYCODONE HCL 5 MG/5ML PO SOLN: 5.0000 mg | Freq: Once | ORAL | Status: AC | PRN

## 2024-06-22 MED ORDER — MIDAZOLAM HCL 2 MG/2ML IJ SOLN
INTRAMUSCULAR | Status: DC | PRN
  Administered 2024-06-22: 2 mg via INTRAVENOUS

## 2024-06-22 MED ORDER — LIDOCAINE HCL (PF) 2 % IJ SOLN
INTRAMUSCULAR | Status: AC
  Filled 2024-06-22: qty 5

## 2024-06-22 MED ORDER — PHENYLEPHRINE HCL-NACL 20-0.9 MG/250ML-% IV SOLN
INTRAVENOUS | Status: AC
  Filled 2024-06-22: qty 250

## 2024-06-22 MED ORDER — LIDOCAINE HCL (CARDIAC) PF 100 MG/5ML IV SOSY
PREFILLED_SYRINGE | INTRAVENOUS | Status: DC | PRN
  Administered 2024-06-22: 100 mg via INTRAVENOUS

## 2024-06-22 MED ORDER — ONDANSETRON HCL 4 MG/2ML IJ SOLN
INTRAMUSCULAR | Status: DC | PRN
  Administered 2024-06-22: 4 mg via INTRAVENOUS

## 2024-06-22 MED ORDER — OXYCODONE HCL 5 MG PO TABS
ORAL_TABLET | ORAL | Status: AC
  Filled 2024-06-22: qty 1

## 2024-06-22 MED ORDER — PHENYLEPHRINE 80 MCG/ML (10ML) SYRINGE FOR IV PUSH (FOR BLOOD PRESSURE SUPPORT)
PREFILLED_SYRINGE | INTRAVENOUS | Status: DC | PRN
  Administered 2024-06-22: 160 ug via INTRAVENOUS

## 2024-06-22 MED ORDER — FENTANYL CITRATE (PF) 100 MCG/2ML IJ SOLN: 25.0000 ug | INTRAMUSCULAR | Status: DC | PRN

## 2024-06-22 MED ORDER — CEFAZOLIN SODIUM-DEXTROSE 2-4 GM/100ML-% IV SOLN
INTRAVENOUS | Status: AC
  Filled 2024-06-22: qty 100

## 2024-06-22 MED ORDER — FENTANYL CITRATE (PF) 100 MCG/2ML IJ SOLN
INTRAMUSCULAR | Status: AC
Start: 2024-06-22 — End: 2024-06-22
  Filled 2024-06-22: qty 2

## 2024-06-22 MED ORDER — ACETAMINOPHEN 10 MG/ML IV SOLN: 1000.0000 mg | Freq: Once | INTRAVENOUS | Status: DC | PRN

## 2024-06-22 MED ORDER — IOHEXOL 180 MG/ML  SOLN
INTRAMUSCULAR | Status: DC | PRN
  Administered 2024-06-22: 10 mL

## 2024-06-22 MED ORDER — MIDAZOLAM HCL 2 MG/2ML IJ SOLN
INTRAMUSCULAR | Status: AC
  Filled 2024-06-22: qty 2

## 2024-06-22 MED ORDER — FENTANYL CITRATE (PF) 100 MCG/2ML IJ SOLN
INTRAMUSCULAR | Status: DC | PRN
  Administered 2024-06-22 (×2): 25 ug via INTRAVENOUS
  Administered 2024-06-22: 50 ug via INTRAVENOUS

## 2024-06-22 MED ORDER — EPHEDRINE SULFATE-NACL 50-0.9 MG/10ML-% IV SOSY
PREFILLED_SYRINGE | INTRAVENOUS | Status: DC | PRN
  Administered 2024-06-22: 10 mg via INTRAVENOUS

## 2024-06-22 MED ORDER — ONDANSETRON HCL 4 MG/2ML IJ SOLN
INTRAMUSCULAR | Status: AC
  Filled 2024-06-22: qty 2

## 2024-06-22 MED ORDER — SODIUM CHLORIDE 0.9 % IR SOLN
Status: DC | PRN
  Administered 2024-06-22: 1

## 2024-06-22 MED ORDER — OXYCODONE HCL 5 MG/5ML PO SOLN: 5.0000 mg | Freq: Once | ORAL | Status: DC | PRN

## 2024-06-22 SURGICAL SUPPLY — 18 items
BAG DRAIN SIEMENS DORNER NS (MISCELLANEOUS) ×2 IMPLANT
BAG URINE DRAIN 2000ML AR STRL (UROLOGICAL SUPPLIES) ×2 IMPLANT
BRUSH SCRUB EZ 1% IODOPHOR (MISCELLANEOUS) ×2 IMPLANT
BRUSH SCRUB EZ 4% CHG (MISCELLANEOUS) ×2 IMPLANT
CATH URETL OPEN END 6X70 (CATHETERS) ×2 IMPLANT
DRAPE UTILITY 15X26 TOWEL STRL (DRAPES) ×2 IMPLANT
GLOVE BIOGEL PI IND STRL 7.5 (GLOVE) ×2 IMPLANT
GOWN STRL REUS W/ TWL XL LVL3 (GOWN DISPOSABLE) ×2 IMPLANT
GOWN STRL REUS W/TWL XL LVL4 (GOWN DISPOSABLE) ×2 IMPLANT
GUIDEWIRE STR DUAL SENSOR (WIRE) ×2 IMPLANT
KIT TURNOVER CYSTO (KITS) ×2 IMPLANT
PACK CYSTO AR (MISCELLANEOUS) ×2 IMPLANT
SET CYSTO W/LG BORE CLAMP LF (SET/KITS/TRAYS/PACK) ×2 IMPLANT
SOL .9 NS 3000ML IRR UROMATIC (IV SOLUTION) ×4 IMPLANT
SURGILUBE 2OZ TUBE FLIPTOP (MISCELLANEOUS) ×2 IMPLANT
SYR 10ML LL (SYRINGE) ×2 IMPLANT
SYRINGE TOOMEY IRRIG 70ML (MISCELLANEOUS) ×2 IMPLANT
WATER STERILE IRR 500ML POUR (IV SOLUTION) ×2 IMPLANT

## 2024-06-22 NOTE — Anesthesia Postprocedure Evaluation (Signed)
 Anesthesia Post Note  Patient: Balraj L Hintz  Procedure(s) Performed: CYSTOSCOPY, WITH RETROGRADE PYELOGRAM (Right)  Patient location during evaluation: PACU Anesthesia Type: General Level of consciousness: awake and alert Pain management: pain level controlled Vital Signs Assessment: post-procedure vital signs reviewed and stable Respiratory status: spontaneous breathing, nonlabored ventilation and respiratory function stable Cardiovascular status: blood pressure returned to baseline and stable Postop Assessment: no apparent nausea or vomiting Anesthetic complications: no   No notable events documented.   Last Vitals:  Vitals:   06/22/24 1126 06/22/24 1128  BP:  (!) 143/87  Pulse: (!) 57 (!) 58  Resp: 16   Temp: (!) 36.1 C   SpO2: 99% 99%    Last Pain:  Vitals:   06/22/24 1145  TempSrc:   PainSc: 6                  Camellia Merilee Louder

## 2024-06-22 NOTE — Anesthesia Procedure Notes (Signed)
 Procedure Name: LMA Insertion Date/Time: 06/22/2024 9:49 AM  Performed by: Jackye Spanner, CRNAPre-anesthesia Checklist: Patient identified, Emergency Drugs available, Suction available, Patient being monitored and Timeout performed Patient Re-evaluated:Patient Re-evaluated prior to induction Oxygen Delivery Method: Circle system utilized Preoxygenation: Pre-oxygenation with 100% oxygen Induction Type: IV induction Ventilation: Mask ventilation without difficulty LMA: LMA inserted LMA Size: 4.0 Placement Confirmation: positive ETCO2 and breath sounds checked- equal and bilateral Tube secured with: Tape Dental Injury: Teeth and Oropharynx as per pre-operative assessment

## 2024-06-22 NOTE — Op Note (Signed)
   Preoperative diagnosis:  Microhematuria History right lower quadrant abdominal pain  Postoperative diagnosis:  Same  Procedure: Cystoscopy with right retrograde pyelogram  Surgeon: [name], MD  Anesthesia: General  Complications: None  Intraoperative findings:  Cystoscopy: Urethra normal in caliber without stricture.  Prostate with mild lateral lobe enlargement and moderate bladder neck elevation.  Prominent hypervascularity of the prostatic urethra with friability/oozing.  Bladder mucosa normal in appearance without erythema, solid or papillary lesions.  UOs normal-appearing bilaterally with clear efflux. Right retrograde pyelogram: Ureter normal in caliber without filling defect, dilation.  Collecting system delicate without dilation or filling defect.  No filling defect renal pelvis.   EBL: Minimal  Specimens: None  Indication: Charles Shepard is a 63 y.o. patient with a history of right lower quadrant abdominal pain with intermittent radiation to the right hemiscrotum.  CT abdomen/pelvis with contrast showed no evidence of right hydronephrosis or stone.  He has chronic microhematuria with prior negative evaluations.  Refer to the H&P for details after reviewing the management options for treatment, he elected to proceed with the above surgical procedure(s). We have discussed the potential benefits and risks of the procedure, side effects of the proposed treatment, the likelihood of the patient achieving the goals of the procedure, and any potential problems that might occur during the procedure or recuperation. Informed consent has been obtained.  Description of procedure:  The patient was taken to the operating room and general anesthesia was induced.  The patient was placed in the dorsal lithotomy position, prepped and draped in the usual sterile fashion, and preoperative antibiotics were administered. A preoperative time-out was performed.   A 21 French cystoscope with  30 degree lens was lubricated, placed per urethra and advanced proximally into the bladder under direct vision with findings as described above.  A 62F open ended ureteral catheter was placed through the cystoscope and into the right UO.  10 mL of Omnipaque  contrast was instilled under fluoroscopy with findings as described above.  The bladder was emptied and the cystoscope was removed.  After anesthetic reversal he was transported to the PACU in stable condition.  Plan: Postop follow-up will be scheduled in ~1 month   [name], M.D.

## 2024-06-22 NOTE — Anesthesia Preprocedure Evaluation (Addendum)
 Anesthesia Evaluation  Patient identified by MRN, date of birth, ID band Patient awake    Reviewed: Allergy & Precautions, H&P , NPO status , Patient's Chart, lab work & pertinent test results  Airway Mallampati: III  TM Distance: >3 FB Neck ROM: full    Dental  (+) Chipped   Pulmonary Patient abstained from smoking., former smoker   Pulmonary exam normal        Cardiovascular hypertension, Normal cardiovascular exam  ECHO 4/25: CONCLUSION -------------------------------------------------------------------------------  NORMAL LEFT VENTRICULAR SYSTOLIC FUNCTION WITH NO LVH  ESTIMATED EF: >55%, CALC EF(2D): 62%  NORMAL LA PRESSURES WITH NORMAL DIASTOLIC FUNCTION  NORMAL RIGHT VENTRICULAR SYSTOLIC FUNCTION  VALVULAR REGURGITATION: MILD AR, MILD MR, TRIVIAL PR, TRIVIAL TR  NO VALVULAR STENOSIS     Neuro/Psych negative neurological ROS  negative psych ROS   GI/Hepatic negative GI ROS, Neg liver ROS,,,  Endo/Other  negative endocrine ROS    Renal/GU      Musculoskeletal   Abdominal Normal abdominal exam  (+)   Peds  Hematology negative hematology ROS (+)   Anesthesia Other Findings Past Medical History: No date: Atypical chest pain No date: DDD (degenerative disc disease), lumbar No date: Dyspnea No date: GERD (gastroesophageal reflux disease) No date: Heart murmur No date: HLD (hyperlipidemia) No date: Hoarse voice quality No date: Hypertension No date: Long-term use of aspirin therapy No date: Motion sickness     Comment:  amusement park rides No date: Prediabetes 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 No date: Vertigo  Past Surgical History: No date: APPENDECTOMY 06/03/2021: BOTOX  INJECTION; N/A     Comment:  Procedure: BOTOX  INJECTION;  Surgeon: Jordis Laneta FALCON,               MD;  Location: [facility];  Service: General;  Laterality:               N/A; 1999: broken leg     Comment:  Left, has a rod  and a screw from ankle to knee 05/21/2021: CARPAL TUNNEL RELEASE; Bilateral 2008: CERVICAL DISCECTOMY     Comment:  c-5 2014: COLONOSCOPY     Comment:  Dr Jinny 03/04/2020: COLONOSCOPY WITH PROPOFOL ; N/A     Comment:  Procedure: COLONOSCOPY WITH PROPOFOL ;  Surgeon: Jinny Carmine, MD;  Location: [facility];  Service:               Endoscopy;  Laterality: N/A;  priority 4 06/03/2021: SPHINCTEROTOMY; N/A     Comment:  Procedure: SPHINCTEROTOMY, chemical;  Surgeon: Jordis Laneta FALCON, MD;  Location: [facility];  Service: General;                Laterality: N/A;     Reproductive/Obstetrics negative OB ROS                              Anesthesia Physical Anesthesia Plan  ASA: 2  Anesthesia Plan: General LMA   Post-op Pain Management: Toradol  IV (intra-op)* and Ofirmev  IV (intra-op)*   Induction: Intravenous  PONV Risk Score and Plan: 2 and Dexamethasone , Ondansetron  and Midazolam   Airway Management Planned: LMA  Additional Equipment:   Intra-op Plan:   Post-operative Plan: Extubation in OR  Informed Consent: I have reviewed the patients History and Physical, chart, labs and discussed the procedure  including the risks, benefits and alternatives for the proposed anesthesia with the patient or authorized representative who has indicated his/her understanding and acceptance.     Dental Advisory Given  Plan Discussed with: Anesthesiologist, CRNA and Surgeon  Anesthesia Plan Comments:          Anesthesia Quick Evaluation

## 2024-06-22 NOTE — Discharge Instructions (Signed)
 Cystoscopy patient instructions  You may pass blood clots in your urine, especially if you had a biopsy. It is not unusual to pass small blood clots and have some bloody urine a couple of weeks after your cystoscopy. Again, call your doctor if the bleeding does not subside. You may have: Dysuria (painful urination) Frequency (urinating often) Urgency (strong desire to urinate)  These symptoms are common. Avoiding alcohol and caffeine, such as coffee, tea, and chocolate, may help relieve these symptoms. Drink plenty of water , unless otherwise instructed.  Azo for burning obtained over-the-counter can help the symptoms  No abnormalities were identified on cystoscopy or on x-ray examination of your right ureter.  No biopsy was performed. Special Instructions:   If you are going home with a catheter in place do not take a tub bath until removed by your doctor.   You may resume your normal activities.   Do not drive or operate machinery if you are taking narcotic pain medicine.   Be sure to keep all follow-up appointments with your doctor.   Call Your Doctor If: You have severe pain You are unable to urinate You have a fever over 101 You have severe bleeding

## 2024-06-22 NOTE — Transfer of Care (Signed)
 Immediate Anesthesia Transfer of Care Note  Patient: Charles Shepard  Procedure(s) Performed: CYSTOSCOPY, WITH RETROGRADE PYELOGRAM (Right)  Patient Location: PACU  Anesthesia Type:General  Level of Consciousness: drowsy  Airway & Oxygen Therapy: Patient Spontanous Breathing and Patient connected to face mask oxygen  Post-op Assessment: Report given to RN and Post -op Vital signs reviewed and stable  Post vital signs: Reviewed and stable  Last Vitals:  Vitals Value Taken Time  BP 112/61 06/22/24 10:18  Temp 35.8 1018  Pulse 64 06/22/24 10:19  Resp 15 06/22/24 10:19  SpO2 99 % 06/22/24 10:19  Vitals shown include unfiled device data.  Last Pain:  Vitals:   06/22/24 0931  TempSrc: Temporal  PainSc: 0-No pain         Complications: No notable events documented.

## 2024-06-22 NOTE — Interval H&P Note (Signed)
 History and Physical Interval Note:  06/22/2024 9:19 AM  Charles Shepard  has presented today for surgery, with the diagnosis of Microhematuria, Right Lower Quadrant Pain.  The various methods of treatment have been discussed with the patient and family. 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  The patient's history has been reviewed, patient examined, no change in status, stable for surgery.  I have reviewed the patient's chart and labs.  Questions were answered to the patient's satisfaction.    CV:RRR Lungs:clear  [name]

## 2024-06-23 ENCOUNTER — Encounter: Payer: Self-pay | Admitting: Urology

## 2024-07-25 ENCOUNTER — Ambulatory Visit: Admitting: Urology

## 2024-08-15 ENCOUNTER — Ambulatory Visit: Admitting: Urology

## 2024-09-11 ENCOUNTER — Telehealth: Payer: Self-pay | Admitting: Urology

## 2024-09-11 NOTE — Telephone Encounter (Signed)
 Talked with Charles Shepard and she states I can send in 30 pills of flomax to his pharmacy . Patient to call us  back and let us  know where to send them to he is out of town working. We will send in once patient calls us  back.

## 2024-09-11 NOTE — Telephone Encounter (Signed)
 Pt asked if Dr. Twylla wanted him to continue taking Tamsulosin.  If so, he will need a refill.

## 2024-09-12 ENCOUNTER — Ambulatory Visit: Admitting: Urology

## 2024-09-14 ENCOUNTER — Telehealth: Payer: Self-pay

## 2024-09-14 DIAGNOSIS — R3129 Other microscopic hematuria: Secondary | ICD-10-CM

## 2024-09-14 MED ORDER — TAMSULOSIN HCL 0.4 MG PO CAPS
0.4000 mg | ORAL_CAPSULE | Freq: Every day | ORAL | 0 refills | Status: AC
Start: 2024-09-14 — End: 2025-09-14

## 2024-09-14 NOTE — Telephone Encounter (Signed)
 Called in 30 days worth of Flomax to pt to the pharmacy given in [place]

## 2024-09-14 NOTE — Telephone Encounter (Signed)
 Sent 30 days of Flomax to specified pharmacy per patient.

## 2024-09-27 ENCOUNTER — Ambulatory Visit (INDEPENDENT_AMBULATORY_CARE_PROVIDER_SITE_OTHER): Admitting: Urology

## 2024-09-27 ENCOUNTER — Encounter: Payer: Self-pay | Admitting: Urology

## 2024-09-27 VITALS — BP 144/77 | HR 65 | Ht 70.0 in | Wt 195.0 lb

## 2024-09-27 DIAGNOSIS — R3129 Other microscopic hematuria: Secondary | ICD-10-CM

## 2024-09-27 NOTE — Progress Notes (Signed)
 09/27/2024 10:11 AM   Charles Shepard 1961/07/23 [id number]  Referring provider: Cyrus Selinda Moose, PA-C [address] [place],  [place] [address]  Chief Complaint  Patient presents with   Medication Problem    HPI: Charles Shepard is a 63 y.o. male presents for postop follow-up.  Refer to my prior note 06/13/2024 He underwent cystoscopy with right retrograde pyelogram 06/22/2024.  No significant abnormalities were noted and his right retrograde pyelogram was unremarkable He has no complaints today and states his pain has resolved. No bothersome LUTS He was inquiring if he could discontinue tamsulosin   PMH: Past Medical History:  Diagnosis Date   Atypical chest pain    DDD (degenerative disc disease), lumbar    Dyspnea    GERD (gastroesophageal reflux disease)    Heart murmur    HLD (hyperlipidemia)    Hoarse voice quality    Hypertension    Long-term use of aspirin therapy    Motion sickness    amusement park rides   Prediabetes    Trigeminal autonomic cephalgias    Vertigo     Surgical History: Past Surgical History:  Procedure Laterality Date   APPENDECTOMY     BOTOX  INJECTION N/A 06/03/2021   Procedure: BOTOX  INJECTION;  Surgeon: Jordis Laneta FALCON, MD;  Location: [facility];  Service: General;  Laterality: N/A;   broken leg  1999   Left, has a rod and a screw from ankle to knee   CARPAL TUNNEL RELEASE Bilateral 05/21/2021   CERVICAL DISCECTOMY  2008   c-5   COLONOSCOPY  2014   Dr Jinny   COLONOSCOPY WITH PROPOFOL  N/A 03/04/2020   Procedure: COLONOSCOPY WITH PROPOFOL ;  Surgeon: Jinny Carmine, MD;  Location: [facility];  Service: Endoscopy;  Laterality: N/A;  priority 4   CYSTOSCOPY W/ RETROGRADES Right 06/22/2024   Procedure: CYSTOSCOPY, WITH RETROGRADE PYELOGRAM;  Surgeon: Twylla Charles BROCKS, MD;  Location: [facility];  Service: Urology;  Laterality: Right;   SPHINCTEROTOMY N/A 06/03/2021   Procedure: SPHINCTEROTOMY, chemical;  Surgeon: Jordis Laneta FALCON, MD;  Location: [facility];  Service: General;  Laterality: N/A;    Home Medications:  Allergies as of 09/27/2024   No Known Allergies      Medication List        Accurate as of September 27, 2024 10:11 AM. If you have any questions, ask your nurse or doctor.          STOP taking these medications    sulfamethoxazole-trimethoprim 800-160 MG tablet Commonly known as: BACTRIM DS       TAKE these medications    amLODipine 5 MG tablet Commonly known as: NORVASC Take 5 mg by mouth daily.   Aspirin Low Dose 81 MG tablet Generic drug: aspirin EC Take 81 mg by mouth daily.   dexlansoprazole  60 MG capsule Commonly known as: DEXILANT  Take 60 mg by mouth.   rosuvastatin  20 MG tablet Commonly known as: CRESTOR  TAKE 1 TABLET BY MOUTH EVERY DAY   tamsulosin 0.4 MG Caps capsule Commonly known as: FLOMAX Take 1 capsule (0.4 mg total) by mouth daily.        Allergies: No Known Allergies  Family History: Family History  Problem Relation Age of Onset   Hyperlipidemia Mother    Diabetes Paternal Aunt    Colon cancer Neg Hx     Social History:  reports that he quit smoking about 16 years ago. His smoking use included cigarettes. He started smoking about 26 years  ago. He has a 5 pack-year smoking history. He has quit using smokeless tobacco. He reports current alcohol use of about 12.0 standard drinks of alcohol per week. He reports current drug use. Drug: Marijuana.   Physical Exam: BP (!) 144/77   Pulse 65   Ht 5' 10 (1.778 m)   Wt 195 lb (88.5 kg)   SpO2 96%   BMI 27.98 kg/m   Constitutional:  Alert, No acute distress. HEENT: [place] AT Respiratory: Normal respiratory effort, no increased work of breathing. Psychiatric: Normal mood and affect.   Assessment & Plan:    1.  Abdominal pain Resolved Okay to discontinue tamsulosin   2.  Microhematuria Negative evaluation 1 year follow-up with [facility]  Charles JAYSON Barba, MD  [facility] [address], [address] [place], [place] [address] [phone]

## 2024-10-06 ENCOUNTER — Other Ambulatory Visit: Payer: Self-pay | Admitting: Physician Assistant

## 2024-10-06 DIAGNOSIS — R3129 Other microscopic hematuria: Secondary | ICD-10-CM

## 2025-09-28 ENCOUNTER — Ambulatory Visit: Admitting: Urology
